# Patient Record
Sex: Female | Born: 1960 | Race: White | Hispanic: No | Marital: Married | Smoking: Never smoker
Health system: Southern US, Community
[De-identification: ages and names within clinical notes are randomized; demographics above are authoritative.]

## PROBLEM LIST (undated history)

## (undated) DIAGNOSIS — G2581 Restless legs syndrome: Secondary | ICD-10-CM

## (undated) DIAGNOSIS — D649 Anemia, unspecified: Secondary | ICD-10-CM

## (undated) DIAGNOSIS — F419 Anxiety disorder, unspecified: Secondary | ICD-10-CM

## (undated) DIAGNOSIS — M8580 Other specified disorders of bone density and structure, unspecified site: Secondary | ICD-10-CM

## (undated) HISTORY — DX: Anemia, unspecified: D64.9

## (undated) HISTORY — PX: ABDOMINAL HYSTERECTOMY: SHX81

## (undated) HISTORY — PX: TUBAL LIGATION: SHX77

## (undated) HISTORY — DX: Anxiety disorder, unspecified: F41.9

## (undated) HISTORY — DX: Restless legs syndrome: G25.81

## (undated) HISTORY — DX: Other specified disorders of bone density and structure, unspecified site: M85.80

## (undated) HISTORY — PX: CHOLECYSTECTOMY: SHX55

---

## 1997-10-19 ENCOUNTER — Encounter: Admission: RE | Admit: 1997-10-19 | Discharge: 1998-01-17 | Payer: Self-pay | Admitting: *Deleted

## 2001-12-03 ENCOUNTER — Other Ambulatory Visit: Admission: RE | Admit: 2001-12-03 | Discharge: 2001-12-03 | Payer: Self-pay | Admitting: Obstetrics & Gynecology

## 2002-12-09 ENCOUNTER — Ambulatory Visit (HOSPITAL_COMMUNITY): Admission: RE | Admit: 2002-12-09 | Discharge: 2002-12-09 | Payer: Self-pay | Admitting: Gastroenterology

## 2003-11-24 ENCOUNTER — Other Ambulatory Visit: Admission: RE | Admit: 2003-11-24 | Discharge: 2003-11-24 | Payer: Self-pay | Admitting: Obstetrics and Gynecology

## 2004-11-12 ENCOUNTER — Encounter: Admission: RE | Admit: 2004-11-12 | Discharge: 2004-12-19 | Payer: Self-pay | Admitting: Family Medicine

## 2005-02-12 ENCOUNTER — Other Ambulatory Visit: Admission: RE | Admit: 2005-02-12 | Discharge: 2005-02-12 | Payer: Self-pay | Admitting: Obstetrics and Gynecology

## 2006-08-04 ENCOUNTER — Other Ambulatory Visit: Admission: RE | Admit: 2006-08-04 | Discharge: 2006-08-04 | Payer: Self-pay | Admitting: Family Medicine

## 2009-04-06 ENCOUNTER — Encounter (INDEPENDENT_AMBULATORY_CARE_PROVIDER_SITE_OTHER): Payer: Self-pay | Admitting: Obstetrics and Gynecology

## 2009-04-06 ENCOUNTER — Inpatient Hospital Stay (HOSPITAL_COMMUNITY): Admission: RE | Admit: 2009-04-06 | Discharge: 2009-04-08 | Payer: Self-pay | Admitting: Obstetrics and Gynecology

## 2010-07-29 ENCOUNTER — Encounter
Admission: RE | Admit: 2010-07-29 | Discharge: 2010-07-29 | Payer: Self-pay | Source: Home / Self Care | Attending: Obstetrics and Gynecology | Admitting: Obstetrics and Gynecology

## 2010-08-21 ENCOUNTER — Encounter: Payer: Self-pay | Admitting: Obstetrics and Gynecology

## 2010-10-25 LAB — URINALYSIS, ROUTINE W REFLEX MICROSCOPIC
Bilirubin Urine: NEGATIVE
Nitrite: NEGATIVE
Protein, ur: NEGATIVE mg/dL
Specific Gravity, Urine: <1.005 — ABNORMAL LOW (ref 1.005–1.030)
Urobilinogen, UA: 0.2 mg/dL (ref 0.0–1.0)

## 2010-10-25 LAB — HEMOGLOBIN AND HEMATOCRIT, BLOOD
HCT: 38.4 % (ref 36.0–46.0)
Hemoglobin: 12.6 g/dL (ref 12.0–15.0)

## 2010-10-25 LAB — COMPREHENSIVE METABOLIC PANEL
ALT: 9 U/L (ref 0–35)
AST: 19 U/L (ref 0–37)
CO2: 28 mEq/L (ref 19–32)
Calcium: 9.3 mg/dL (ref 8.4–10.5)
Chloride: 103 mEq/L (ref 96–112)
Creatinine, Ser: 0.57 mg/dL (ref 0.4–1.2)
GFR calc non Af Amer: >60 mL/min (ref >60)
Glucose, Bld: 90 mg/dL (ref 70–99)
Sodium: 138 mEq/L (ref 135–145)
Total Bilirubin: 0.4 mg/dL (ref 0.3–1.2)

## 2010-10-25 LAB — URINE MICROSCOPIC-ADD ON

## 2010-10-25 LAB — CBC
Hemoglobin: 11.4 g/dL — ABNORMAL LOW (ref 12.0–15.0)
Hemoglobin: 12.8 g/dL (ref 12.0–15.0)
MCHC: 32.9 g/dL (ref 30.0–36.0)
MCV: 82.6 fL (ref 78.0–100.0)
RBC: 4.1 MIL/uL (ref 3.87–5.11)
RBC: 4.69 MIL/uL (ref 3.87–5.11)
WBC: 11.3 10*3/uL — ABNORMAL HIGH (ref 4.0–10.5)
WBC: 15.6 10*3/uL — ABNORMAL HIGH (ref 4.0–10.5)

## 2010-10-25 LAB — PREGNANCY, URINE: Preg Test, Ur: NEGATIVE

## 2010-10-25 LAB — APTT: aPTT: 28 seconds (ref 24–37)

## 2010-12-06 NOTE — Op Note (Signed)
   NAME:  Susan Whitney, Susan Whitney                            ACCOUNT NO.:  [id number]   MEDICAL RECORD NO.:  [id number]                   PATIENT TYPE:  AMB   LOCATION:  ENDO                                 FACILITY:  [facility]   PHYSICIAN:  John C. Madilyn Fireman, M.D.                 DATE OF BIRTH:  Jun 05, 1961   DATE OF PROCEDURE:  12/09/2002  DATE OF DISCHARGE:                                 OPERATIVE REPORT   PROCEDURE:  Colonoscopy.   INDICATIONS FOR PROCEDURE:  Iron deficiency anemia and possible family  history of colon cancer in a first-degree relative.   DESCRIPTION OF PROCEDURE:  The patient was placed in the left lateral  decubitus position and placed on the pulse monitor with continuous low-flow  oxygen delivered by nasal cannula.  She was sedated with 62.5 mcg IV  fentanyl and 6 mg IV Versed.  The Olympus video colonoscope was inserted  into the rectum and advanced to the cecum, confirmed by transillumination at  McBurney's point and visualization of the ileocecal valve and appendiceal  orifice.  The prep was excellent.  The cecum, ascending, transverse,  descending, and sigmoid colon all appeared normal with no masses, polyps,  diverticula, or other mucosal abnormalities.  The rectum likewise appeared  normal, and retroflexed view of the anus revealed no obvious internal  hemorrhoids.  The colonoscope was then withdrawn, and the patient returned  to the recovery room in stable condition.  She tolerated the procedure well,  and there were no immediate complications.   IMPRESSION:  Normal colonoscopy.   PLAN:  Consider other causes of her iron deficiency anemia.  No further  gastrointestinal work-up planned at this point.                                               John C. Madilyn Fireman, M.D.    [facility]/MEDQ  D:  12/09/2002  T:  12/09/2002  Job:  [id number]   cc:   Ernestina Penna, M.D.  [address] [place]  [place] [address]  Fax: [phone]

## 2012-11-09 ENCOUNTER — Ambulatory Visit (INDEPENDENT_AMBULATORY_CARE_PROVIDER_SITE_OTHER): Payer: BC Managed Care – PPO | Admitting: Nurse Practitioner

## 2012-11-09 VITALS — BP 114/73 | HR 84 | Temp 97.0°F | Ht 66.0 in | Wt 208.0 lb

## 2012-11-09 DIAGNOSIS — R05 Cough: Secondary | ICD-10-CM

## 2012-11-09 MED ORDER — HYDROCODONE-HOMATROPINE 5-1.5 MG/5ML PO SYRP: 5.0000 mL | ORAL_SOLUTION | Freq: Three times a day (TID) | ORAL | Status: DC | PRN

## 2012-11-09 MED ORDER — METHYLPREDNISOLONE ACETATE 80 MG/ML IJ SUSP
80.0000 mg | Freq: Once | INTRAMUSCULAR | Status: AC
  Administered 2012-11-09: 80 mg via INTRAMUSCULAR

## 2012-11-09 NOTE — Progress Notes (Signed)
  Subjective:    Patient ID: Charlett Lango, female    DOB: [birth year], 52 y.o.   MRN: [id number]  HPI-Patient in complaining of Cough . Started 3week. Has been unchanged since started. Associated symptoms include nasal congestion. She has tried tussin OTC with slight relief.     Review of Systems  Constitutional: Negative for fever and chills.  HENT: Positive for congestion. Negative for ear pain, rhinorrhea, sneezing, postnasal drip and sinus pressure.   Eyes: Negative.   Respiratory: Positive for cough (nonproductive).   Cardiovascular: Negative.   Gastrointestinal: Negative.   Psychiatric/Behavioral: Negative.        Objective:   Physical Exam  Constitutional: She is oriented to person, place, and time. She appears well-developed and well-nourished.  HENT:  Head: Normocephalic.  Right Ear: External ear normal.  Nose: Nose normal.  Mouth/Throat: Oropharynx is clear and moist.  Eyes: Pupils are equal, round, and reactive to light.  Neck: Normal range of motion. Neck supple.  Cardiovascular: Normal rate, regular rhythm, normal heart sounds and intact distal pulses.   No murmur heard. Pulmonary/Chest: Effort normal and breath sounds normal. She has no wheezes. She has no rales.  Dry cough  Lymphadenopathy:    She has no cervical adenopathy.  Neurological: She is alert and oriented to person, place, and time.  Skin: Skin is warm and dry.   BP 114/73  Pulse 84  Temp(Src) 97 F (36.1 C) (Oral)  Ht 5\' 6"  (1.676 m)  Wt 208 lb (94.348 kg)  BMI 33.59 kg/m2        Assessment & Plan:  1. Cough First 24 Hours-Clear liquids  popsicles  Jello  gatorade  Sprite Second 24 hours-Add Full liquids ( Liquids you cant see through) Third 24 hours- Bland diet ( foods that are baked or broiled)  *avoiding fried foods and highly spiced foods* During these 3 days  Avoid milk, cheese, ice cream or any other dairy products  Avoid caffeine- REMEMBER Mt. Dew and Mello Yellow contain  lots of caffeine You should eat and drink in  Frequent small volumes If no improvement in symptoms or worsen in 2-3 days should RETRUN TO OFFICE or go to ER! -Hycodan 1 tsp po Q6 prn cough - methylPREDNISolone acetate (DEPO-MEDROL) injection 80 mg; Inject 1 mL (80 mg total) into the muscle once. [name], FNP

## 2012-11-09 NOTE — Patient Instructions (Signed)

## 2012-11-24 ENCOUNTER — Encounter: Payer: Self-pay | Admitting: Nurse Practitioner

## 2012-11-24 ENCOUNTER — Ambulatory Visit (INDEPENDENT_AMBULATORY_CARE_PROVIDER_SITE_OTHER): Payer: BC Managed Care – PPO | Admitting: Nurse Practitioner

## 2012-11-24 VITALS — BP 114/76 | HR 68 | Temp 97.5°F | Ht 65.5 in | Wt 208.0 lb

## 2012-11-24 DIAGNOSIS — J209 Acute bronchitis, unspecified: Secondary | ICD-10-CM

## 2012-11-24 MED ORDER — HYDROCODONE-HOMATROPINE 5-1.5 MG/5ML PO SYRP: 5.0000 mL | ORAL_SOLUTION | Freq: Three times a day (TID) | ORAL | Status: DC | PRN

## 2012-11-24 MED ORDER — AZITHROMYCIN 250 MG PO TABS: ORAL_TABLET | ORAL | Status: DC

## 2012-11-24 MED ORDER — PREDNISONE 20 MG PO TABS: ORAL_TABLET | ORAL | Status: DC

## 2012-11-24 NOTE — Progress Notes (Signed)
  Subjective:    Patient ID: Susan Whitney, female    DOB: 21-Jun-1961, 52 y.o.   MRN: [id number]  Cough This is a new problem. The current episode started 1 to 4 weeks ago. The problem has been waxing and waning. The problem occurs constantly. The cough is productive of sputum. Associated symptoms include rhinorrhea and shortness of breath (very mild). Pertinent negatives include no ear congestion, ear pain, fever, headaches, nasal congestion or postnasal drip. Nothing aggravates the symptoms. She has tried OTC cough suppressant (steriod shot 9 days ago) for the symptoms. The treatment provided mild relief.  - Patient here for follow up of cough. Has had bronchitis for about 2 weeks now and cough just will not seem to go away.    Review of Systems  Constitutional: Negative for fever.  HENT: Positive for congestion and rhinorrhea. Negative for ear pain, postnasal drip and ear discharge.   Respiratory: Positive for cough and shortness of breath (very mild).   Neurological: Negative for headaches.       Objective:   Physical Exam  Constitutional: She appears well-developed and well-nourished.  HENT:  Head: Normocephalic.  Right Ear: External ear normal.  Left Ear: External ear normal.  Mouth/Throat: Oropharynx is clear and moist.  Eyes: EOM are normal. Pupils are equal, round, and reactive to light.  Neck: Normal range of motion. Neck supple.  Cardiovascular: Normal rate, normal heart sounds and intact distal pulses.   Pulmonary/Chest: Effort normal.  Scattered exp rhonchi throughout lungs  Lymphadenopathy:    She has no cervical adenopathy.   BP 114/76  Pulse 68  Temp(Src) 97.5 F (36.4 C) (Oral)  Ht 5' 5.5" (1.664 m)  Wt 208 lb (94.348 kg)  BMI 34.07 kg/m2        Assessment & Plan:  1. Acute bronchitis Force fluids Rest RTO prn - predniSONE (DELTASONE) 20 MG tablet; 2 PO qd  Dispense: 10 tablet; Refill: 0 - azithromycin (ZITHROMAX Z-PAK) 250 MG tablet; As directed   Dispense: 6 each; Refill: 0 - HYDROcodone-homatropine (HYCODAN) 5-1.5 MG/5ML syrup; Take 5 mLs by mouth every 8 (eight) hours as needed for cough.  Dispense: 120 mL; Refill: 0  Susan Daphine Deutscher, FNP

## 2012-11-24 NOTE — Patient Instructions (Signed)

## 2012-11-30 ENCOUNTER — Other Ambulatory Visit: Payer: Self-pay | Admitting: Nurse Practitioner

## 2012-12-23 ENCOUNTER — Encounter: Payer: Self-pay | Admitting: Family Medicine

## 2012-12-23 ENCOUNTER — Ambulatory Visit (INDEPENDENT_AMBULATORY_CARE_PROVIDER_SITE_OTHER): Payer: BC Managed Care – PPO

## 2012-12-23 ENCOUNTER — Ambulatory Visit (INDEPENDENT_AMBULATORY_CARE_PROVIDER_SITE_OTHER): Payer: BC Managed Care – PPO | Admitting: Family Medicine

## 2012-12-23 VITALS — BP 116/76 | HR 68 | Temp 96.9°F | Ht 65.0 in | Wt 206.0 lb

## 2012-12-23 DIAGNOSIS — R05 Cough: Secondary | ICD-10-CM

## 2012-12-23 DIAGNOSIS — J9801 Acute bronchospasm: Secondary | ICD-10-CM

## 2012-12-23 LAB — POCT CBC
Granulocyte percent: 71.4 %G (ref 37–80)
Lymph, poc: 2.4 (ref 0.6–3.4)
MCV: 81.5 fL (ref 80–97)
MPV: 8 fL (ref 0–99.8)
POC Granulocyte: 7.4 — AB (ref 2–6.9)
POC LYMPH PERCENT: 23.2 %L (ref 10–50)
Platelet Count, POC: 332 10*3/uL (ref 142–424)
RBC: 5.1 M/uL (ref 4.04–5.48)
RDW, POC: 14.6 %
WBC: 10.4 10*3/uL — AB (ref 4.6–10.2)

## 2012-12-23 LAB — COMPLETE METABOLIC PANEL WITH GFR
ALT: <8 U/L (ref 0–35)
AST: 14 U/L (ref 0–37)
Albumin: 4.2 g/dL (ref 3.5–5.2)
Alkaline Phosphatase: 103 U/L (ref 39–117)
BUN: 13 mg/dL (ref 6–23)
Chloride: 102 mEq/L (ref 96–112)
Potassium: 4.9 mEq/L (ref 3.5–5.3)
Sodium: 137 mEq/L (ref 135–145)

## 2012-12-23 MED ORDER — HYDROCOD POLST-CHLORPHEN POLST 10-8 MG/5ML PO LQCR: 5.0000 mL | Freq: Every evening | ORAL | Status: DC | PRN

## 2012-12-23 MED ORDER — PREDNISONE 10 MG PO TABS: ORAL_TABLET | ORAL | Status: DC

## 2012-12-23 MED ORDER — METHYLPREDNISOLONE ACETATE 80 MG/ML IJ SUSP
60.0000 mg | Freq: Once | INTRAMUSCULAR | Status: AC
  Administered 2012-12-23: 60 mg via INTRAMUSCULAR

## 2012-12-23 MED ORDER — BUDESONIDE-FORMOTEROL FUMARATE 160-4.5 MCG/ACT IN AERO: 2.0000 | INHALATION_SPRAY | Freq: Two times a day (BID) | RESPIRATORY_TRACT | Status: DC

## 2012-12-23 NOTE — Patient Instructions (Addendum)
Drink plenty of fluids Use a cool mist humidifier in the bedroom at nighttime Take plain Mucinex maximum strength over-the-counter one twice daily with a large glass of water regularly Use Symbicort 160/4.5  2 puffs twice daily, rinse mouth after using Take medications as directed

## 2012-12-23 NOTE — Addendum Note (Signed)
Addended by: Baltazar Apo on: 12/23/2012 12:35 PM   Modules accepted: Orders

## 2012-12-23 NOTE — Progress Notes (Signed)
Subjective:    Patient ID: Susan Whitney, female    DOB: 1960-10-25, 52 y.o.   MRN: [id number]  HPI Patient has had a persistent cough for up to 6 weeks. She saw a provider about 3 weeks ago and was treated with Zithromax and prednisone. This helped some while she was taking it but the problem has returned. The cough is nonproductive. There has been no fever. No significant seasonal allergy history. She does have a pet dog that stays in the house. She has been wheezing at times. This is worse at night and in the morning when she gets out of bed. One of the biggest complaints is that persistent cough.   Review of Systems Review of systems is essentially negative except for the cough and the persistent nature of this cough. She denies chest pain headaches sore throat ear pain GI symptoms urinary symptoms.    Objective:   Physical Exam  Vitals reviewed. Constitutional: She is oriented to person, place, and time. She appears well-developed and well-nourished. No distress.  HENT:  Head: Normocephalic and atraumatic.  Right Ear: External ear normal.  Left Ear: External ear normal.  Mouth/Throat: No oropharyngeal exudate.  Tonsils are prominent bilaterally right greater than left. There is nasal congestion bilaterally with turbinate swelling.  Eyes: Conjunctivae are normal. Right eye exhibits no discharge. Left eye exhibits no discharge. No scleral icterus.  Neck: Normal range of motion. Neck supple. No thyromegaly present.  Cardiovascular: Normal rate, regular rhythm and normal heart sounds.  Exam reveals no gallop.   No murmur heard. Pulmonary/Chest: Effort normal and breath sounds normal. No respiratory distress. She has no wheezes. She has no rales.  Abdominal: Soft. Bowel sounds are normal. She exhibits no mass. There is no tenderness. There is no rebound and no guarding.  Musculoskeletal: Normal range of motion.  Lymphadenopathy:    She has no cervical adenopathy (anterior cervical  tenderness).  Neurological: She is alert and oriented to person, place, and time.  Skin: Skin is warm and dry. No rash noted. No erythema.  Psychiatric: She has a normal mood and affect. Her behavior is normal. Judgment and thought content normal.      Results for orders placed in visit on 12/23/12  POCT CBC      Result Value Range   WBC 10.4 (*) 4.6 - 10.2 K/uL   Lymph, poc 2.4  0.6 - 3.4   POC LYMPH PERCENT 23.2  10 - 50 %L   POC Granulocyte 7.4 (*) 2 - 6.9   Granulocyte percent 71.4  37 - 80 %G   RBC 5.1  4.04 - 5.48 M/uL   Hemoglobin 13.9  12.2 - 16.2 g/dL   HCT, POC 40.9  81.1 - 47.9 %   MCV 81.5  80 - 97 fL   MCH, POC 27.3  27 - 31.2 pg   MCHC 33.5  31.8 - 35.4 g/dL   RDW, POC 91.4     Platelet Count, POC 332.0  142 - 424 K/uL   MPV 8.0  0 - 99.8 fL         Assessment & Plan:  1. Cough - POCT CBC - COMPLETE METABOLIC PANEL WITH GFR - DG Chest 2 View; Future - predniSONE (DELTASONE) 10 MG tablet; 1 tablet 4 times a day for 2 days,  1 tablet 3 times a day for 2 days,  1 tablet 2 times a day for 2 days, 1 tablet daily for 2 days  Dispense: 20 tablet;  Refill: 0 - chlorpheniramine-HYDROcodone (TUSSIONEX PENNKINETIC ER) 10-8 MG/5ML LQCR; Take 5 mLs by mouth at bedtime as needed.  Dispense: 140 mL; Refill: 0  2. Bronchospasm - predniSONE (DELTASONE) 10 MG tablet; 1 tablet 4 times a day for 2 days,  1 tablet 3 times a day for 2 days,  1 tablet 2 times a day for 2 days, 1 tablet daily for 2 days  Dispense: 20 tablet; Refill: 0  Patient Instructions  Drink plenty of fluids Use a cool mist humidifier in the bedroom at nighttime Take plain Mucinex maximum strength over-the-counter one twice daily with a large glass of water regularly Use Symbicort 160/4.5  2 puffs twice daily, rinse mouth after using Take medications as directed

## 2012-12-23 NOTE — Addendum Note (Signed)
Addended by: Baltazar Apo on: 12/23/2012 12:37 PM   Modules accepted: Orders

## 2013-02-14 ENCOUNTER — Ambulatory Visit (INDEPENDENT_AMBULATORY_CARE_PROVIDER_SITE_OTHER): Payer: BC Managed Care – PPO | Admitting: Nurse Practitioner

## 2013-02-14 ENCOUNTER — Encounter: Payer: Self-pay | Admitting: Nurse Practitioner

## 2013-02-14 VITALS — BP 116/81 | HR 69 | Temp 97.2°F | Ht 65.0 in | Wt 208.0 lb

## 2013-02-14 DIAGNOSIS — J019 Acute sinusitis, unspecified: Secondary | ICD-10-CM

## 2013-02-14 DIAGNOSIS — F329 Major depressive disorder, single episode, unspecified: Secondary | ICD-10-CM

## 2013-02-14 DIAGNOSIS — B9689 Other specified bacterial agents as the cause of diseases classified elsewhere: Secondary | ICD-10-CM

## 2013-02-14 MED ORDER — AZITHROMYCIN 250 MG PO TABS: ORAL_TABLET | ORAL | Status: DC

## 2013-02-14 NOTE — Progress Notes (Signed)
  Subjective:    Patient ID: Charlett Lango, female    DOB: 1960/12/01, 52 y.o.   MRN: [id number]  HPI Patient in C/O sore throat and cough- Has had a low grade fever and just doesn't feel good. Says that she has a slight cough but feels like it is worsening everyday.    Review of Systems  Constitutional: Positive for fever (low garde). Negative for fatigue.  HENT: Positive for congestion, sore throat, rhinorrhea, trouble swallowing, voice change, postnasal drip and sinus pressure. Negative for ear pain.   Respiratory: Positive for cough (productive).   Cardiovascular: Negative.   Gastrointestinal: Negative.   Genitourinary: Negative.   Musculoskeletal: Negative.   Neurological: Negative.   Hematological: Negative.   Psychiatric/Behavioral: Negative.        Objective:   Physical Exam  Constitutional: She appears well-developed and well-nourished.  HENT:  Right Ear: Hearing, external ear and ear canal normal. Tympanic membrane is erythematous.  Left Ear: Hearing, external ear and ear canal normal. Tympanic membrane is erythematous.  Nose: Mucosal edema and rhinorrhea present. Right sinus exhibits maxillary sinus tenderness. Right sinus exhibits no frontal sinus tenderness. Left sinus exhibits maxillary sinus tenderness. Left sinus exhibits no frontal sinus tenderness.  Mouth/Throat: Posterior oropharyngeal erythema: mild.  Cardiovascular: Normal rate, regular rhythm and normal heart sounds.   Pulmonary/Chest: Effort normal and breath sounds normal.  Dry cough  Skin: Skin is warm and dry.    BP 116/81  Pulse 69  Temp(Src) 97.2 F (36.2 C) (Oral)  Ht 5\' 5"  (1.651 m)  Wt 208 lb (94.348 kg)  BMI 34.61 kg/m2       Assessment & Plan:  1. Acute bacterial rhinosinusitis 1. Take meds as prescribed 2. Use a cool mist humidifier especially during the winter months and when heat has  been humid. 3. Use saline nose sprays frequently 4. Saline irrigations of the nose can be very  helpful if done frequently.  * 4X daily for 1 week*  * Use of a nettie pot can be helpful with this. Follow directions with this* 5. Drink plenty of fluids 6. Keep thermostat turn down low 7.For any cough or congestion  Use plain Mucinex- regular strength or max strength is fine   * Children- consult with Pharmacist for dosing 8. For fever or aces or pains- take tylenol or ibuprofen appropriate for age and weight.  * for fevers greater than 101 orally you may alternate ibuprofen and tylenol every  3 hours.   - azithromycin (ZITHROMAX Z-PAK) 250 MG tablet; As directed  Dispense: 6 each; Refill: 0 [name], FNP

## 2013-02-14 NOTE — Patient Instructions (Signed)

## 2013-03-03 ENCOUNTER — Encounter: Payer: Self-pay | Admitting: Family Medicine

## 2013-03-03 ENCOUNTER — Ambulatory Visit (INDEPENDENT_AMBULATORY_CARE_PROVIDER_SITE_OTHER): Payer: BC Managed Care – PPO

## 2013-03-03 ENCOUNTER — Ambulatory Visit (INDEPENDENT_AMBULATORY_CARE_PROVIDER_SITE_OTHER): Payer: BC Managed Care – PPO | Admitting: Family Medicine

## 2013-03-03 VITALS — BP 106/68 | HR 85 | Temp 97.9°F | Wt 214.4 lb

## 2013-03-03 DIAGNOSIS — D72829 Elevated white blood cell count, unspecified: Secondary | ICD-10-CM

## 2013-03-03 DIAGNOSIS — R05 Cough: Secondary | ICD-10-CM

## 2013-03-03 LAB — POCT CBC
Granulocyte percent: 72.1 %G (ref 37–80)
HCT, POC: 38.6 % (ref 37.7–47.9)
Lymph, poc: 2.8 (ref 0.6–3.4)
POC Granulocyte: 9.8 — AB (ref 2–6.9)
Platelet Count, POC: 334 10*3/uL (ref 142–424)
RBC: 4.8 M/uL (ref 4.04–5.48)
RDW, POC: 14.6 %
WBC: 13.6 10*3/uL — AB (ref 4.6–10.2)

## 2013-03-03 LAB — POCT RAPID STREP A (OFFICE): Rapid Strep A Screen: NEGATIVE

## 2013-03-03 MED ORDER — METHYLPREDNISOLONE ACETATE 80 MG/ML IJ SUSP
60.0000 mg | Freq: Once | INTRAMUSCULAR | Status: AC
  Administered 2013-03-03: 60 mg via INTRAMUSCULAR

## 2013-03-03 MED ORDER — PREDNISONE 10 MG PO TABS: 10.0000 mg | ORAL_TABLET | Freq: Every day | ORAL | Status: DC

## 2013-03-03 MED ORDER — CEFUROXIME AXETIL 250 MG PO TABS: 250.0000 mg | ORAL_TABLET | Freq: Two times a day (BID) | ORAL | Status: DC

## 2013-03-03 NOTE — Patient Instructions (Signed)
Take meds as directed Referral to Dr Jetty Duhamel if no better in 10-14  Days.

## 2013-03-03 NOTE — Progress Notes (Signed)
Subjective:    Patient ID: Susan Whitney, female    DOB: 1961/04/13, 52 y.o.   MRN: [id number]  HPI Patient had an episode of cough and congestion for several weeks back in the spring. She had a chest x-ray around June 6 which was normal. In this initial episode she took antibiotics. She finally got better after the prednisone and Depo-Medrol. She was doing better until about 10 days ago and had chills and a sore throat and redeveloped another cough and took another Z-Pak which she has completed. The achiness most recently has gotten better but she continues on with a persistent cough.Now she has no sore throat and occasionally feels like she has tears that are stopped the    Review of Systems  Constitutional: Positive for chills (at night) and fatigue.  HENT: Negative for ear pain, congestion, sore throat, voice change, postnasal drip and sinus pressure.   Eyes: Positive for discharge (watery).  Respiratory: Positive for cough, chest tightness and shortness of breath.   Cardiovascular: Negative for chest pain, palpitations and leg swelling.  Gastrointestinal: Positive for nausea (slight). Negative for abdominal pain, diarrhea and constipation.  Genitourinary: Negative.   Musculoskeletal: Positive for myalgias (at night, achiness due to cough).  Allergic/Immunologic: Negative for environmental allergies.  Neurological: Positive for light-headedness and headaches.  Hematological: Negative.   Psychiatric/Behavioral: Negative.        Objective:   Physical Exam  Constitutional: She is oriented to person, place, and time. She appears well-developed and well-nourished. No distress.  HENT:  Head: Normocephalic and atraumatic.  Right Ear: External ear normal.  Left Ear: External ear normal.  Nose: Nose normal.  Slight swelling posterior throat, left greater than right  Eyes: Conjunctivae are normal. Right eye exhibits no discharge. Left eye exhibits no discharge. No scleral icterus.  Neck:  Normal range of motion. Neck supple. No thyromegaly present.  There was cervical tenderness bilateral  Cardiovascular: Normal rate, regular rhythm and normal heart sounds.   Pulmonary/Chest: Effort normal and breath sounds normal. No respiratory distress. She has no wheezes. She has no rales.  Musculoskeletal: Normal range of motion.  Lymphadenopathy:    She has no cervical adenopathy.  Neurological: She is alert and oriented to person, place, and time.  Skin: Skin is warm and dry. No rash noted. She is not diaphoretic.  Psychiatric: Her behavior is normal. Judgment and thought content normal.   Results for orders placed in visit on 03/03/13  POCT CBC      Result Value Range   WBC 13.6 (*) 4.6 - 10.2 K/uL   Lymph, poc 2.8  0.6 - 3.4   POC LYMPH PERCENT 20.9  10 - 50 %L   POC Granulocyte 9.8 (*) 2 - 6.9   Granulocyte percent 72.1  37 - 80 %G   RBC 4.8  4.04 - 5.48 M/uL   Hemoglobin 13.1  12.2 - 16.2 g/dL   HCT, POC 40.9  81.1 - 47.9 %   MCV 80.3  80 - 97 fL   MCH, POC 27.3  27 - 31.2 pg   MCHC 34.0  31.8 - 35.4 g/dL   RDW, POC 91.4     Platelet Count, POC 334.0  142 - 424 K/uL   MPV 7.8  0 - 99.8 fL    [facility] reading (PRIMARY) by  Dr. Christell Constant: CXR: No change from previous chest x-ray                                -  Rapid strep was negative and patient aware       Assessment & Plan:  1. Cough - POCT CBC - POCT rapid strep A - Upper Respiratory Culture, Routine - DG Chest 2 View  2. Elevated white blood cell count - POCT rapid strep A - Upper Respiratory Culture, Routine - DG Chest 2 View -Throat culture pending  3. Bronchitis- -Ceftin 250 twice daily for 10 days -60 of Depo-Medrol IM -Prednisone 10 mg #20 taper -Take Mucinex blue and white, maximum strength one twice daily with a large glass water -Use Symbicort 164.5, one puff twice daily, rinse mouth after use  Patient Instructions  Take meds as directed Referral to Dr Jetty Duhamel if no better in 10-14   Days.   Drink plenty of fluids  Take Tylenol for aches pains and fever  Nyra Capes MD

## 2013-03-03 NOTE — Addendum Note (Signed)
Addended by: Bearl Mulberry on: 03/03/2013 05:28 PM   Modules accepted: Orders

## 2013-03-05 ENCOUNTER — Other Ambulatory Visit: Payer: Self-pay | Admitting: Nurse Practitioner

## 2013-03-06 LAB — UPPER RESPIRATORY CULTURE, ROUTINE

## 2013-04-27 ENCOUNTER — Ambulatory Visit (INDEPENDENT_AMBULATORY_CARE_PROVIDER_SITE_OTHER): Payer: BC Managed Care – PPO | Admitting: Internal Medicine

## 2013-04-27 ENCOUNTER — Encounter: Payer: Self-pay | Admitting: Internal Medicine

## 2013-04-27 VITALS — BP 104/70 | HR 73 | Temp 98.1°F | Ht 65.5 in | Wt 217.0 lb

## 2013-04-27 DIAGNOSIS — R05 Cough: Secondary | ICD-10-CM | POA: Insufficient documentation

## 2013-04-27 DIAGNOSIS — J329 Chronic sinusitis, unspecified: Secondary | ICD-10-CM

## 2013-04-27 DIAGNOSIS — R059 Cough, unspecified: Secondary | ICD-10-CM | POA: Insufficient documentation

## 2013-04-27 MED ORDER — PREDNISONE (PAK) 10 MG PO TABS: ORAL_TABLET | ORAL | Status: DC

## 2013-04-27 MED ORDER — FAMOTIDINE 20 MG PO TABS: ORAL_TABLET | ORAL | Status: DC

## 2013-04-27 MED ORDER — PANTOPRAZOLE SODIUM 40 MG PO TBEC: 40.0000 mg | DELAYED_RELEASE_TABLET | Freq: Every day | ORAL | Status: DC

## 2013-04-27 NOTE — Patient Instructions (Signed)
Pantoprazole (protonix) 40 mg   Take 30-60 min before first meal of the day and Pepcid 20 mg one bedtime along with chlortrimeton 4 mg  until return to office - this is the best way to tell whether stomach acid is contributing to your problem.    Delsym 2 tsp every 12 hours (tessalon pearls also might work would need to call it in if needed)   Prednisone 10 mg take  4 each am x 2 days,   2 each am x 2 days,  1 each am x 2 days and stop   Please see patient coordinator before you leave today  to schedule sinus ct  GERD (REFLUX)  is an extremely common cause of respiratory symptoms, many times with no significant heartburn at all.    It can be treated with medication, but also with lifestyle changes including avoidance of late meals, excessive alcohol, smoking cessation, and avoid fatty foods, chocolate, peppermint, colas, red wine, and acidic juices such as orange juice.  NO MINT OR MENTHOL PRODUCTS SO NO COUGH DROPS  USE SUGARLESS CANDY INSTEAD (jolley ranchers or Stover's)  NO OIL BASED VITAMINS - use powdered substitutes.    Please schedule a follow up office visit in 4 weeks, sooner if needed

## 2013-04-27 NOTE — Progress Notes (Signed)
  Subjective:    Patient ID: Susan Whitney, female    DOB: 1961/04/04   MRN: [id number]  HPI  47 yowf never smoker but lots of passive smoke as child/adult and recurrent ear infections/bronchitis but ok activity tol and no need for inhalers and got fine in between bouts and got better out of smokey environments in past  then developed chronic cough starting May 2014 and referred to pulmonary clinic 04/27/2013 by Dr Christell Constant for eval of persistent cough.     04/27/2013 1st [facility] office visit/ Kanaan Kagawa cc daily cough starting May 2014 rx for allergies and did completely eliminate the cough s need for cough med x one month then indolent onset progressively worse cough then cleared again on pred.  Taking mucinex dm some better, no better on inhaler ? Ventolin > heart racing  Cough wakes her up in am > yellowish thick min phlegm with nasal congestion some better p stirring and then worse again at bedtime pretty much every night  No obvious day to day or daytime variabilty or assoc  cp or chest tightness, subjective wheeze overt sinus or hb symptoms. No unusual exp hx or h/o childhood pna/ asthma or knowledge of premature birth.   . Also denies any obvious fluctuation of symptoms with weather or environmental changes or other aggravating or alleviating factors except as outlined above   Current Medications, Allergies, Complete Past Medical History, Past Surgical History, Family History, and Social History were reviewed in [employer] record.          Review of Systems  Constitutional: Negative for fever, chills and unexpected weight change.  HENT: Positive for congestion, postnasal drip, rhinorrhea, sneezing and sore throat. Negative for dental problem, ear pain, nosebleeds, sinus pressure, trouble swallowing and voice change.   Eyes: Negative for visual disturbance.  Respiratory: Positive for cough and shortness of breath. Negative for choking.   Cardiovascular: Negative  for chest pain and leg swelling.  Gastrointestinal: Positive for diarrhea. Negative for vomiting and abdominal pain.  Genitourinary: Negative for difficulty urinating.  Musculoskeletal: Negative for arthralgias.  Skin: Negative for rash.  Neurological: Positive for headaches. Negative for tremors and syncope.  Hematological: Does not bruise/bleed easily.       Objective:   Physical Exam  amb wf slt nasal tone to voice  Wt Readings from Last 3 Encounters:  04/27/13 217 lb (98.431 kg)  03/03/13 214 lb 6.4 oz (97.251 kg)  02/14/13 208 lb (94.348 kg)      HEENT: nl dentition, turbinates, and orophanx. Nl external ear canals without cough reflex   NECK :  without JVD/Nodes/TM/ nl carotid upstrokes bilaterally   LUNGS: no acc muscle use, clear to A and P bilaterally  Cough  on insp    CV:  RRR  no s3 or murmur or increase in P2, no edema   ABD:  soft and nontender with nl excursion in the supine position. No bruits or organomegaly, bowel sounds nl  MS:  warm without deformities, calf tenderness, cyanosis or clubbing  SKIN: warm and dry without lesions    NEURO:  alert, approp, no deficits    cxr 03/03/13 wnl   Sinus CT 04/29/13 Maxillary sinuses: Bilateral air-fluid levels indicating acuity.  Mucosal thickening is associated laterally and superiorly.      Assessment & Plan:

## 2013-04-29 ENCOUNTER — Ambulatory Visit (INDEPENDENT_AMBULATORY_CARE_PROVIDER_SITE_OTHER)
Admission: RE | Admit: 2013-04-29 | Discharge: 2013-04-29 | Disposition: A | Discharge status: Home / Self Care | Payer: BC Managed Care – PPO | Source: Ambulatory Visit | Attending: Internal Medicine | Admitting: Internal Medicine

## 2013-04-29 ENCOUNTER — Encounter: Payer: Self-pay | Admitting: Internal Medicine

## 2013-04-29 DIAGNOSIS — R05 Cough: Secondary | ICD-10-CM

## 2013-04-30 DIAGNOSIS — J329 Chronic sinusitis, unspecified: Secondary | ICD-10-CM | POA: Insufficient documentation

## 2013-04-30 NOTE — Assessment & Plan Note (Signed)
-   Sinus CT 04/29/2013 > Chronic and acute sinusitis> augmentin bid x 10 days then ov

## 2013-04-30 NOTE — Assessment & Plan Note (Addendum)
The most common causes of chronic cough in immunocompetent adults include the following: upper airway cough syndrome (UACS), previously referred to as postnasal drip syndrome (PNDS), which is caused by variety of rhinosinus conditions; (2) asthma; (3) GERD; (4) chronic bronchitis from cigarette smoking or other inhaled environmental irritants; (5) nonasthmatic eosinophilic bronchitis; and (6) bronchiectasis.   These conditions, singly or in combination, have accounted for up to 94% of the causes of chronic cough in prospective studies.   Other conditions have constituted no >6% of the causes in prospective studies These have included bronchogenic carcinoma, chronic interstitial pneumonia, sarcoidosis, left ventricular failure, ACEI-induced cough, and aspiration from a condition associated with pharyngeal dysfunction.    Chronic cough is often simultaneously caused by more than one condition. A single cause has been found from 38 to 82% of the time, multiple causes from 18 to 62%. Multiply caused cough has been the result of three diseases up to 42% of the time.       Most likely this is pnds related to acute and chronic sinusitis (see sep a/p) but given chronicity needs rx for gerd secondary to coughing until at least the cough resolves then regroup  See instructions for specific recommendations which were reviewed directly with the patient who was given a copy with highlighter outlining the key components.

## 2013-05-02 ENCOUNTER — Other Ambulatory Visit: Payer: Self-pay | Admitting: Internal Medicine

## 2013-05-02 MED ORDER — AMOXICILLIN-POT CLAVULANATE 875-125 MG PO TABS: 1.0000 | ORAL_TABLET | Freq: Two times a day (BID) | ORAL | Status: DC

## 2013-05-02 NOTE — Progress Notes (Signed)
Quick Note:  Spoke with pt and notified of results per Dr. Sherene Sires. Pt verbalized understanding and denied any questions. rx was sent to [facility] and rov scheduled for 05/11/13 ______

## 2013-05-11 ENCOUNTER — Ambulatory Visit (INDEPENDENT_AMBULATORY_CARE_PROVIDER_SITE_OTHER): Payer: BC Managed Care – PPO | Admitting: Internal Medicine

## 2013-05-11 ENCOUNTER — Encounter: Payer: Self-pay | Admitting: Internal Medicine

## 2013-05-11 ENCOUNTER — Other Ambulatory Visit (INDEPENDENT_AMBULATORY_CARE_PROVIDER_SITE_OTHER): Payer: BC Managed Care – PPO

## 2013-05-11 VITALS — BP 118/74 | HR 80 | Temp 98.2°F | Ht 65.5 in | Wt 220.0 lb

## 2013-05-11 DIAGNOSIS — J329 Chronic sinusitis, unspecified: Secondary | ICD-10-CM

## 2013-05-11 DIAGNOSIS — R059 Cough, unspecified: Secondary | ICD-10-CM

## 2013-05-11 DIAGNOSIS — R05 Cough: Secondary | ICD-10-CM

## 2013-05-11 LAB — CBC WITH DIFFERENTIAL/PLATELET
Basophils Relative: 1.1 % (ref 0.0–3.0)
Eosinophils Absolute: 0.5 10*3/uL (ref 0.0–0.7)
Eosinophils Relative: 5.9 % — ABNORMAL HIGH (ref 0.0–5.0)
HCT: 39.5 % (ref 36.0–46.0)
Lymphs Abs: 2 10*3/uL (ref 0.7–4.0)
MCHC: 34.1 g/dL (ref 30.0–36.0)
MCV: 79.7 fl (ref 78.0–100.0)
Monocytes Absolute: 0.6 10*3/uL (ref 0.1–1.0)
Platelets: 334 10*3/uL (ref 150.0–400.0)
RBC: 4.96 Mil/uL (ref 3.87–5.11)
WBC: 8.8 10*3/uL (ref 4.5–10.5)

## 2013-05-11 MED ORDER — AMOXICILLIN-POT CLAVULANATE 875-125 MG PO TABS: 1.0000 | ORAL_TABLET | Freq: Two times a day (BID) | ORAL | Status: DC

## 2013-05-11 NOTE — Progress Notes (Signed)
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    History of Present Illness  04/27/2013 1st [facility] office visit/ Roshaun Pound cc daily cough starting May 2014 rx for allergies and did completely eliminate the cough s need for cough med x one month then indolent onset progressively worse cough then cleared again on pred.  Taking mucinex dm some better, no better on inhaler ? Ventolin > heart racing  Cough wakes her up in am > yellowish thick min phlegm with nasal congestion some better p stirring and then worse again at bedtime pretty much every night rec Pantoprazole (protonix) 40 mg   Take 30-60 min before first meal of the day and Pepcid 20 mg one bedtime along with chlortrimeton 4 mg  until return to office - this is the best way to tell whether stomach acid is contributing to your problem.   Delsym 2 tsp every 12 hours (tessalon pearls also might work would need to call it in if needed)  Prednisone 10 mg take  4 each am x 2 days,   2 each am x 2 days,  1 each am x 2 days and stop  Please see patient coordinator before you leave today  to schedule sinus ct> pos bilat > aug x 10 GERD   05/11/2013 f/u ov/Madisen Ludvigsen re: cough x May 2014 but sense of daily throat drainage x > 5 y Chief Complaint  Patient presents with  . Follow-up    pt states cough has improved, only bothers her in mornings and before bed..  non prod cough but feels congestion in throat.  Reflux has improved.    No obvious day to day or daytime variabilty or assoc sob  cp or  chest tightness, subjective wheeze overt sinus or hb symptoms. No unusual exp hx or h/o childhood pna/ asthma or knowledge of premature birth.  Sleeping ok without nocturnal  or early am exacerbation  of respiratory  c/o's or need for noct saba. Also denies any obvious fluctuation of symptoms with weather or environmental changes or other aggravating or alleviating factors except as outlined above   Current Medications, Allergies, Complete Past Medical History, Past Surgical History, Family History, and Social History were reviewed in [employer] record.  ROS  The following are not active complaints unless bolded sore throat, dysphagia, dental problems, itching, sneezing,  nasal congestion or excess/ purulent secretions, ear ache,   fever, chills, sweats, unintended wt loss, pleuritic or exertional cp, hemoptysis,  orthopnea pnd or leg swelling, presyncope, palpitations, heartburn, abdominal pain, anorexia, nausea, vomiting, diarrhea  or change in bowel or urinary habits, change in stools or urine, dysuria,hematuria,  rash, arthralgias, visual complaints, headache, numbness weakness or ataxia or problems with walking or coordination,  change in mood/affect or memory.        .                Objective:   Physical Exam  amb wf  No longer nasal tone  05/11/2013  220  Wt Readings from Last 3 Encounters:  04/27/13 217 lb (98.431 kg)  03/03/13 214 lb 6.4 oz (97.251 kg)  02/14/13 208 lb (94.348 kg)      HEENT: nl dentition, turbinates, and orophanx. Nl external ear canals without cough reflex. R esotropic strabismus   NECK :  without JVD/Nodes/TM/ nl carotid upstrokes bilaterally   LUNGS: no acc muscle use, clear to A and P bilaterally  Cough  on insp    CV:  RRR  no s3 or murmur or increase in P2, no edema   ABD:  soft and nontender with nl excursion in the supine position. No bruits or organomegaly, bowel sounds nl  MS:  warm without deformities, calf  tenderness, cyanosis or clubbing  SKIN: warm and dry without lesions    NEURO:  alert, approp, no deficits    cxr 03/03/13 wnl   Sinus CT 04/29/13 Maxillary sinuses: Bilateral air-fluid levels indicating acuity.  Mucosal thickening is associated laterally and superiorly.      Assessment & Plan:

## 2013-05-11 NOTE — Patient Instructions (Addendum)
Augmentin 875 mg take one pill twice daily  X 10 days - take at breakfast and supper with large glass of water.  It would help reduce the usual side effects (diarrhea and yeast infections) if you ate cultured yogurt at lunch.   Ok to try off acid suppression now to see if cough flares  Please remember to go to the lab  department downstairs for your tests - we will call you with the results when they are available and decide whether you need additional allergy testing at this point

## 2013-05-12 ENCOUNTER — Telehealth: Payer: Self-pay | Admitting: Internal Medicine

## 2013-05-12 ENCOUNTER — Encounter: Payer: Self-pay | Admitting: Internal Medicine

## 2013-05-12 LAB — ALLERGY PROFILE REGION II-DC, DE, MD, ~~LOC~~, VA
Allergen, D pternoyssinus,d7: <0.1 kU/L
Alternaria Alternata: <0.1 kU/L
Bermuda Grass: <0.1 kU/L
Box Elder IgE: <0.1 kU/L
Cat Dander: <0.1 kU/L
Cladosporium Herbarum: <0.1 kU/L
Cockroach: <0.1 kU/L
D. farinae: <0.1 kU/L
Dog Dander: <0.1 kU/L
IgE (Immunoglobulin E), Serum: 5.1 IU/mL (ref 0.0–180.0)
Meadow Grass: <0.1 kU/L
Oak: <0.1 kU/L
Pecan/Hickory Tree IgE: <0.1 kU/L

## 2013-05-12 NOTE — Telephone Encounter (Signed)
Pt aware of recs and needed nothing further

## 2013-05-12 NOTE — Assessment & Plan Note (Signed)
-   Sinus CT 04/29/2013 > Chronic and acute sinusitis> augmentin bid x 20 days then repeat sinus ct if/when flares

## 2013-05-12 NOTE — Progress Notes (Signed)
Quick Note:  Advised pt of labs per MW. Pt verbalized understand ______

## 2013-05-12 NOTE — Telephone Encounter (Signed)
Spoke with pt about lab result and pt wanting to know next step.  States MW was going to send to ENT or Allergist based on lab results. Labs were normal.  Please advise what you would like done with pt.  Pt also states is to cancel appt 05/25/13 per MW didn't need f/u OV.

## 2013-05-12 NOTE — Assessment & Plan Note (Signed)
-   Sinus CT 04/29/2013 > Chronic and acute sinusitis> augmentin bid x 10 days then ov - Allergy profile 05/11/2013 > 5.9% eos,  IgE 5  Most likely she has chronic sinusitis with pnds triggering cough triggering gerd.  Will extend the augmentin x 10 more days and drop the gerd rx to see when/  if cough flares keeping in mind Chronic cough is often simultaneously caused by more than one condition. A single cause has been found from 38 to 82% of the time, multiple causes from 18 to 62%. Multiply caused cough has been the result of three diseases up to 42% of the time.   If flares only p stop augmentin then needs repeat sinus ct and ent eval    Each maintenance medication was reviewed in detail including most importantly the difference between maintenance and as needed and under what circumstances the prns are to be used.  Please see instructions for details which were reviewed in writing and the patient given a copy.

## 2013-05-12 NOTE — Telephone Encounter (Signed)
Since allergy eval is neg no need to consider allergist and rec  is to treat with 10 more days augmentin while holding the acid suppression to sort out to what extent she has 2 problems vs 1  If cough flares off gerd rx, restart  If cough flares p finish augmentin > repeat sinus ct and then decide whether ent needed

## 2013-05-12 NOTE — Telephone Encounter (Signed)
lmomtcb x1 for pt on named VM.  

## 2013-05-25 ENCOUNTER — Ambulatory Visit: Payer: BC Managed Care – PPO

## 2013-05-25 ENCOUNTER — Ambulatory Visit: Payer: BC Managed Care – PPO | Admitting: Internal Medicine

## 2013-06-08 ENCOUNTER — Other Ambulatory Visit: Payer: Self-pay | Admitting: Family Medicine

## 2013-07-18 ENCOUNTER — Other Ambulatory Visit: Payer: Self-pay | Admitting: Internal Medicine

## 2013-07-18 DIAGNOSIS — R05 Cough: Secondary | ICD-10-CM

## 2013-07-18 MED ORDER — PANTOPRAZOLE SODIUM 40 MG PO TBEC: 40.0000 mg | DELAYED_RELEASE_TABLET | Freq: Every day | ORAL | Status: DC

## 2013-09-11 ENCOUNTER — Other Ambulatory Visit: Payer: Self-pay | Admitting: Family Medicine

## 2013-09-12 ENCOUNTER — Ambulatory Visit (INDEPENDENT_AMBULATORY_CARE_PROVIDER_SITE_OTHER): Payer: BC Managed Care – PPO | Admitting: Nurse Practitioner

## 2013-09-12 VITALS — BP 110/70 | HR 74 | Temp 97.2°F

## 2013-09-12 DIAGNOSIS — J0100 Acute maxillary sinusitis, unspecified: Secondary | ICD-10-CM

## 2013-09-12 MED ORDER — AMOXICILLIN-POT CLAVULANATE 875-125 MG PO TABS: 1.0000 | ORAL_TABLET | Freq: Two times a day (BID) | ORAL | Status: DC

## 2013-09-12 NOTE — Progress Notes (Signed)
   Subjective:    Patient ID: Susan Whitney, female    DOB: [birth year], 53 y.o.   MRN: [id number]  HPI Patient in c/o ears stopped up and cogestion and drainage- Feels tired- slight cough    Review of Systems  Constitutional: Positive for fatigue. Negative for fever, chills and appetite change.  HENT: Positive for congestion, postnasal drip, rhinorrhea and sinus pressure. Negative for ear discharge, ear pain, sore throat and trouble swallowing.   Respiratory: Positive for cough (slight dry cough).        Objective:   Physical Exam  Constitutional: She is oriented to person, place, and time. She appears well-developed and well-nourished.  HENT:  Right Ear: Hearing, tympanic membrane, external ear and ear canal normal.  Left Ear: Hearing, tympanic membrane, external ear and ear canal normal.  Nose: Mucosal edema and rhinorrhea present. Right sinus exhibits maxillary sinus tenderness. Right sinus exhibits no frontal sinus tenderness. Left sinus exhibits maxillary sinus tenderness.  Mouth/Throat: Uvula is midline, oropharynx is clear and moist and mucous membranes are normal.  Eyes: Conjunctivae are normal. Pupils are equal, round, and reactive to light.  Neck: Normal range of motion. Neck supple.  Cardiovascular: Normal rate, regular rhythm and normal heart sounds.   Pulmonary/Chest: Effort normal and breath sounds normal.  Neurological: She is alert and oriented to person, place, and time.  Psychiatric: She has a normal mood and affect. Her behavior is normal. Judgment and thought content normal.    BP 110/70  Pulse 74  Temp(Src) 97.2 F (36.2 C) (Oral)       Assessment & Plan:  1. Sinusitis, acute maxillary Meds ordered this encounter  Medications  . amoxicillin-clavulanate (AUGMENTIN) 875-125 MG per tablet    Sig: Take 1 tablet by mouth 2 (two) times daily.    Dispense:  20 tablet    Refill:  0    Order Specific Question:  Supervising Provider    Answer:  Chipper Herb  [[id number]]   1. Take meds as prescribed 2. Use a cool mist humidifier especially during the winter months and when heat has been humid. 3. Use saline nose sprays frequently 4. Saline irrigations of the nose can be very helpful if done frequently.  * 4X daily for 1 week*  * Use of a nettie pot can be helpful with this. Follow directions with this* 5. Drink plenty of fluids 6. Keep thermostat turn down low 7.For any cough or congestion  Use plain Mucinex- regular strength or max strength is fine   * Children- consult with Pharmacist for dosing 8. For fever or aces or pains- take tylenol or ibuprofen appropriate for age and weight.  * for fevers greater than 101 orally you may alternate ibuprofen and tylenol every  3 hours.   Mary-Margaret Hassell Done, FNP

## 2013-09-12 NOTE — Patient Instructions (Signed)

## 2013-10-09 IMAGING — CR DG CHEST 2V
2 series · 2 of 2 positions shown · non-contrast
Comparison: None.

CLINICAL DATA: Cough

CHEST - 2 VIEW

[view not recorded (1 of 2)]
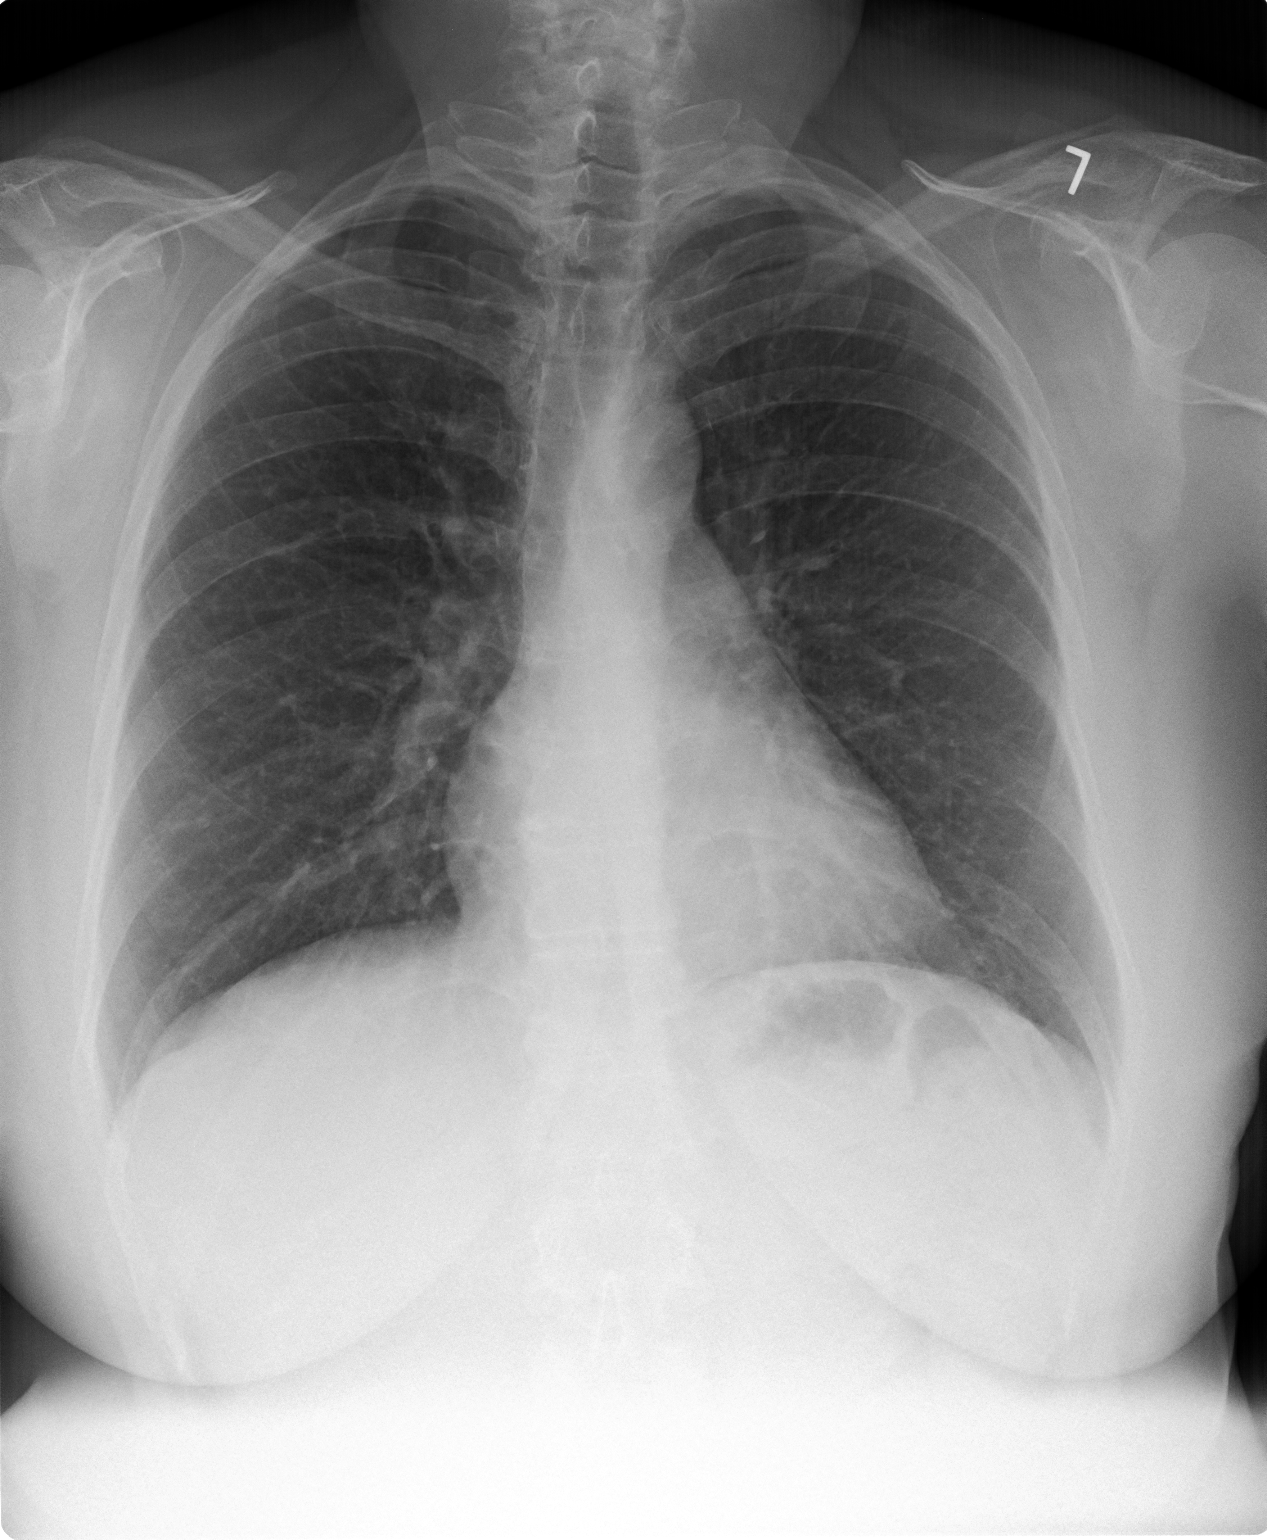

[view not recorded (2 of 2)]
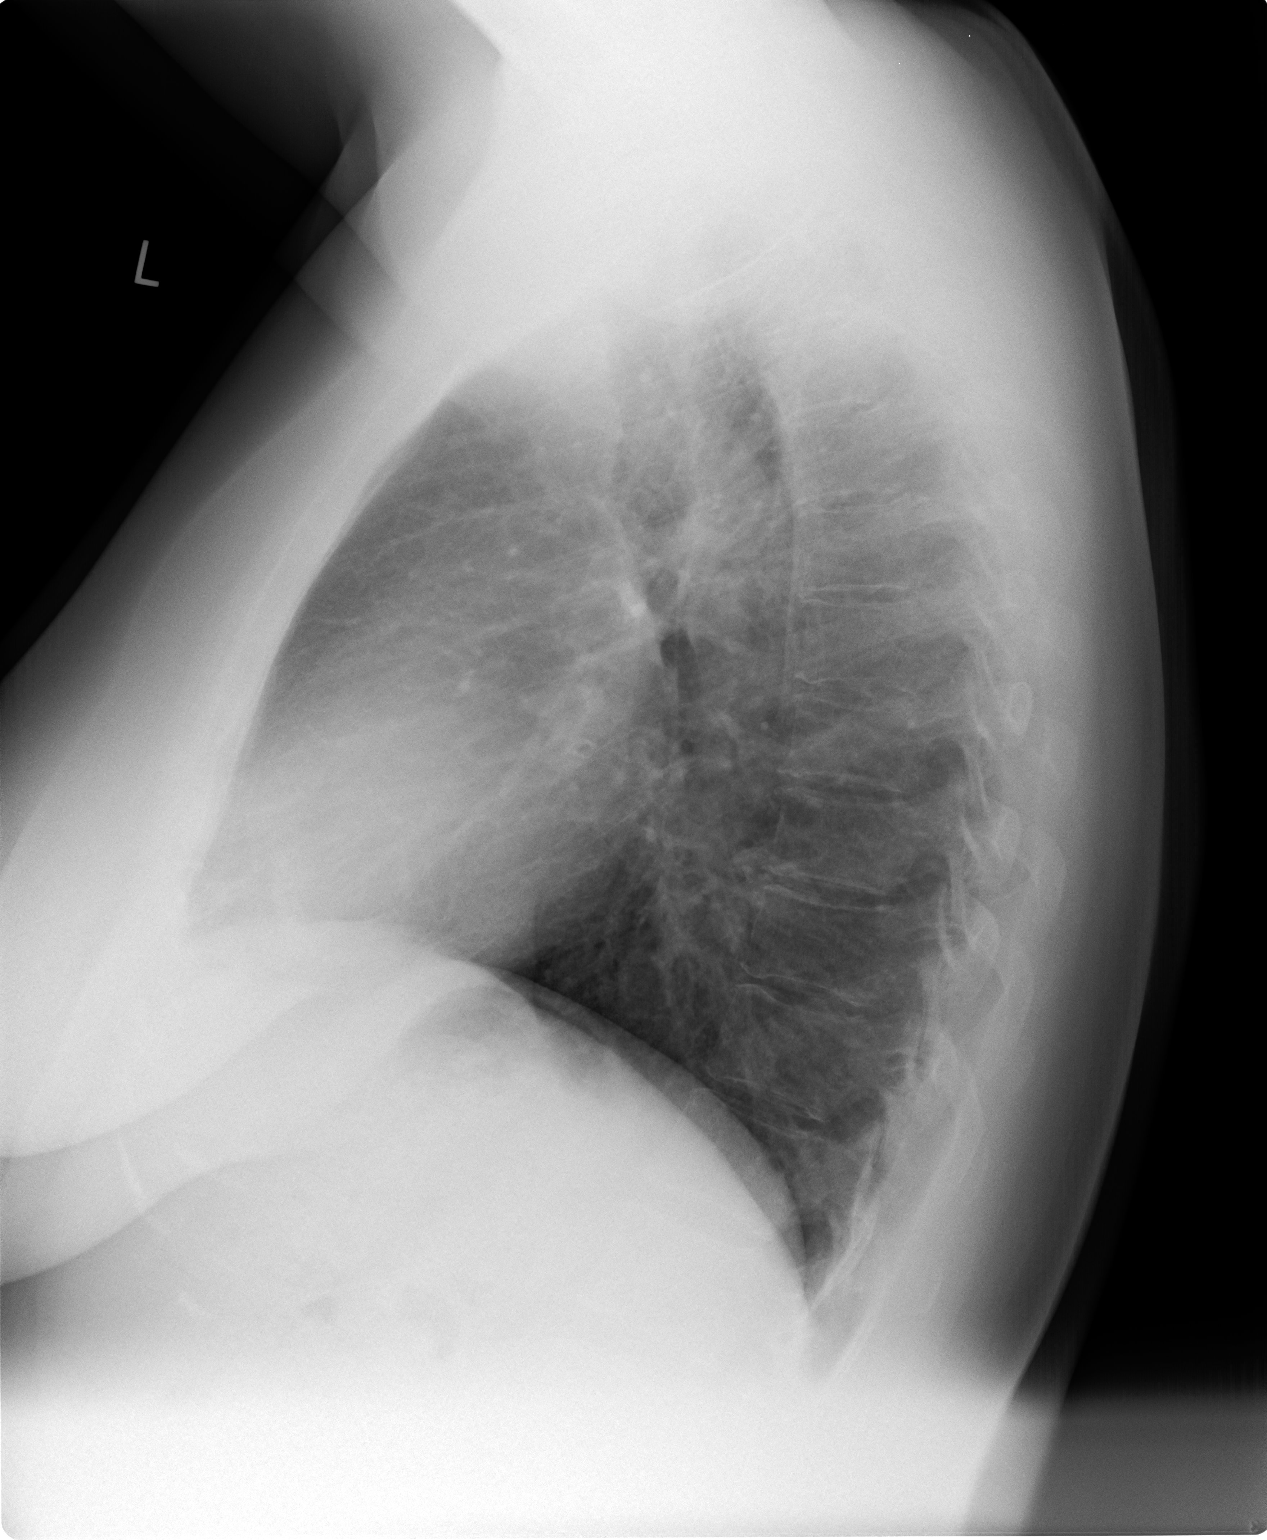

[2 of 2 positions shown; findings below may reference images not displayed]

FINDINGS: Lungs clear.  Heart size and pulmonary vascularity are
normal.  No adenopathy.  No bone lesions.
IMPRESSION: No abnormality noted.

## 2013-11-15 ENCOUNTER — Other Ambulatory Visit: Payer: Self-pay | Admitting: *Deleted

## 2013-11-15 MED ORDER — EST ESTROGENS-METHYLTEST 0.625-1.25 MG PO TABS: 1.0000 | ORAL_TABLET | Freq: Every day | ORAL | Status: DC

## 2013-12-13 ENCOUNTER — Other Ambulatory Visit: Payer: Self-pay

## 2013-12-13 MED ORDER — SERTRALINE HCL 50 MG PO TABS: ORAL_TABLET | ORAL | Status: DC

## 2014-02-14 ENCOUNTER — Other Ambulatory Visit: Payer: Self-pay | Admitting: Nurse Practitioner

## 2014-02-15 NOTE — Telephone Encounter (Signed)
Last ov 2/15. 

## 2014-02-19 ENCOUNTER — Other Ambulatory Visit: Payer: Self-pay | Admitting: Nurse Practitioner

## 2014-04-10 ENCOUNTER — Ambulatory Visit: Payer: Self-pay | Admitting: Nurse Practitioner

## 2014-06-19 ENCOUNTER — Other Ambulatory Visit: Payer: Self-pay | Admitting: Nurse Practitioner

## 2014-07-21 HISTORY — PX: SINUS IRRIGATION: SHX2411

## 2014-08-14 ENCOUNTER — Telehealth: Payer: Self-pay | Admitting: Nurse Practitioner

## 2014-08-14 MED ORDER — AZITHROMYCIN 250 MG PO TABS: ORAL_TABLET | ORAL | Status: DC

## 2014-08-14 NOTE — Telephone Encounter (Signed)
Patient aware rx sent  To pharmacy

## 2014-08-14 NOTE — Telephone Encounter (Signed)
zpak sent to pharmacy 

## 2014-08-24 ENCOUNTER — Telehealth: Payer: Self-pay | Admitting: Nurse Practitioner

## 2014-08-24 DIAGNOSIS — J329 Chronic sinusitis, unspecified: Secondary | ICD-10-CM

## 2014-08-24 NOTE — Telephone Encounter (Signed)
Referral made 

## 2014-09-08 ENCOUNTER — Other Ambulatory Visit: Payer: Self-pay | Admitting: Nurse Practitioner

## 2014-09-08 NOTE — Telephone Encounter (Signed)
Please call in xanax with 1 refills 

## 2014-09-09 NOTE — Telephone Encounter (Signed)
Alprazolam called in to pharmacy

## 2014-09-19 HISTORY — PX: FOOT NEUROMA SURGERY: SHX646

## 2014-11-04 ENCOUNTER — Other Ambulatory Visit: Payer: Self-pay | Admitting: Nurse Practitioner

## 2014-12-07 ENCOUNTER — Other Ambulatory Visit: Payer: Self-pay | Admitting: Otolaryngology

## 2015-01-15 ENCOUNTER — Other Ambulatory Visit: Payer: Self-pay | Admitting: Nurse Practitioner

## 2015-01-15 NOTE — Telephone Encounter (Signed)
Last seen 09/12/14  MMM

## 2015-02-28 ENCOUNTER — Other Ambulatory Visit (INDEPENDENT_AMBULATORY_CARE_PROVIDER_SITE_OTHER): Payer: BLUE CROSS/BLUE SHIELD

## 2015-02-28 ENCOUNTER — Other Ambulatory Visit: Payer: Self-pay | Admitting: Family Medicine

## 2015-02-28 DIAGNOSIS — Z780 Asymptomatic menopausal state: Secondary | ICD-10-CM

## 2015-03-14 ENCOUNTER — Other Ambulatory Visit: Payer: Self-pay | Admitting: Nurse Practitioner

## 2015-03-14 NOTE — Telephone Encounter (Signed)
Last seen 09/12/14 MMM

## 2015-03-28 ENCOUNTER — Encounter: Payer: Self-pay | Admitting: Pharmacist

## 2015-03-28 ENCOUNTER — Ambulatory Visit (INDEPENDENT_AMBULATORY_CARE_PROVIDER_SITE_OTHER): Payer: BLUE CROSS/BLUE SHIELD | Admitting: Pharmacist

## 2015-03-28 VITALS — Ht 65.0 in | Wt 194.5 lb

## 2015-03-28 DIAGNOSIS — M8580 Other specified disorders of bone density and structure, unspecified site: Secondary | ICD-10-CM | POA: Diagnosis not present

## 2015-03-28 NOTE — Progress Notes (Signed)
Patient ID: Susan Whitney, female   DOB: 1961-02-20, 54 y.o.   MRN: [id number]  [facility] Current Height: Height: 5\' 5"  (165.1 cm)      Max Lifetime Height:  5\' 5"  Current Weight: Weight: 194 lb 8 oz (88.225 kg)       Ethnicity:Caucasian    HPI: Does pt already have a diagnosis of:  Osteopenia?  Yes Osteoporosis?  No  Back Pain?  No       Kyphosis?  No Prior fracture?  No Med(s) for Osteoporosis/Osteopenia:  none Med(s) previously tried for Osteoporosis/Osteopenia:  none                                                             PMH: Age at menopause:  sugical Hysterectomy?  Yes Oophorectomy?  No HRT? Yes - Current.  Type/duration: estra test Steroid Use?  No Thyroid med?  No History of cancer?  No History of digestive disorders (ie Crohn's)?  No Current or previous eating disorders?  No Last Vitamin D Result:  needed Last GFR Result:  Has been over 2 years   FH/SH: Family history of osteoporosis?  No  Parent with history of hip fracture?  Yes - mother  Family history of breast cancer?  No Exercise?  No Smoking?  No Alcohol?  No    Calcium Assessment Calcium Intake  # of servings/day  Calcium mg  Milk (8 oz) 0  x  300  = 0  Yogurt (4 oz) 0 x  200 = 0  Cheese (1 oz) 0 x  200 = 0  Other Calcium sources   250mg   Ca supplement 0 = 0   Estimated calcium intake per day 250mg     DEXA Results Date of Test T-Score for AP Spine L1-L4 T-Score for Total Left Hip T-Score for Total Right Hip  02/28/2015 -2.4 -1.7 -2.2  10/02/2003 -1.8 -1.8 --  06/21/2001 -1.9 -1.8 --  06/10/1999 -2.0 -1.8 --   FRAX 10 year estimate: Total FX risk:  14%  (consider medication if >/= 20%) Hip FX risk:  1.4%  (consider medication if >/= 3%)  Assessment: Osteopenia - worsening BMD but not high risk per FRAX estimate  Recommendations: 1.  Continue with estrogen replacement 2.  recommend calcium 1200mg  daily through supplementation and diet.  3.  recommend weight bearing exercise  - 30 minutes at least 4 days per week.   4.  Counseled and educated about fall risk and prevention.  Recheck DEXA:  1 year  Time spent counseling patient:  20 minutes  Cherre Robins, PharmD, CPP

## 2015-03-28 NOTE — Patient Instructions (Signed)
Exercise for Strong Bones  Exercise is important to build and maintain strong bones / bone density.  There are 2 types of exercises that are important to building and maintaining strong bones:  Weight- bearing and muscle-stregthening.  Weight-bearing Exercises  These exercises include activities that make you move against gravity while staying upright. Weight-bearing exercises can be high-impact or low-impact.  High-impact weight-bearing exercises help build bones and keep them strong. If you have broken a bone due to osteoporosis or are at risk of breaking a bone, you may need to avoid high-impact exercises. If you're not sure, you should check with your healthcare provider.  Examples of high-impact weight-bearing exercises are: Dancing  Doing high-impact aerobics  Hiking  Jogging/running  Jumping Rope  Stair climbing  Tennis  Low-impact weight-bearing exercises can also help keep bones strong and are a safe alternative if you cannot do high-impact exercises.   Examples of low-impact weight-bearing exercises are: Using elliptical training machines  Doing low-impact aerobics  Using stair-step machines  Fast walking on a treadmill or outside   Muscle-Strengthening Exercises These exercises include activities where you move your body, a weight or some other resistance against gravity. They are also known as resistance exercises and include: Lifting weights  Using elastic exercise bands  Using weight machines  Lifting your own body weight  Functional movements, such as standing and rising up on your toes  Yoga and Pilates can also improve strength, balance and flexibility. However, certain positions may not be safe for people with osteoporosis or those at increased risk of broken bones. For example, exercises that have you bend forward may increase the chance of breaking a bone in the spine.   Non-Impact Exercises There are other types of exercises that can help  prevent falls.  Non-impact exercises can help you to improve balance, posture and how well you move in everyday activities. Some of these exercises include: Balance exercises that strengthen your legs and test your balance, such as Tai Chi, can decrease your risk of falls.  Posture exercises that improve your posture and reduce rounded or "sloping" shoulders can help you decrease the chance of breaking a bone, especially in the spine.  Functional exercises that improve how well you move can help you with everyday activities and decrease your chance of falling and breaking a bone. For example, if you have trouble getting up from a chair or climbing stairs, you should do these activities as exercises.   **A physical therapist can teach you balance, posture and functional exercises. He/she can also help you learn which exercises are safe and appropriate for you.  [facility] has a physical therapy office in [place] in front of our office and referrals can be made for assessments and treatment as needed and strength and balance training.  If you would like to have an assessment with Chad and our physical therapy team please let a nurse or provider know.   Calcium & Vitamin D: The Facts  Why is calcium and vitamin D consumption important? Calcium: . Most Americans do not consume adequate amounts of calcium! Calcium is required for proper muscle function, nerve communication, bone support, and many other functions in the body.  . The body uses bones as a source of calcium. Bones 'remodel' themselves continuously - the body constantly breaks bone down to release calcium and rebuilds bones by replacing calcium in the bone later.  . As we get older, the rate of bone breakdown occurs faster than bone rebuilding which   could lead to osteopenia, osteoporosis, and possible fractures.   Vitamin D: . People naturally make vitamin D in the body when sunlight hits the skin and triggers a process that leads to  vitamin D production. This natural vitamin D production requires about 10-15 minutes of sun exposure on the hands, arms, and face at least 2-3 times per week. However, due to decreased sun exposure and the use of sunscreen, most people will need to get additional vitamin D from foods or supplements. Your doctor can measure your body's vitamin D level through a simple blood test to determine your daily vitamin D needs.  . Vitamin D is used to help the body absorb calcium, maintain bone health, help the immune system, and reduce inflammation. It also plays a role in muscle performance, balance and risk of falling.  . Vitamin D deficiency can lead to osteomalacia or softening of the bones, bone pain, and muscle weakness.   The recommended daily allowance of Calcium and Vitamin D varies for different age groups. Age group Calcium (mg) Vitamin D (IU)  Females and Males: Age 35-50 1000 mg 600 IU  Females: Age 23- 43 1200 mg 600 IU  Males: Age 86-70 1000 mg 600 IU  Females and Males: Age 18+ 1200 mg 800 IU  Pregnant/lactating Females age 33-50 1000 mg 600 IU   How much Calcium do you get in your diet? Calcium Intake # of servings per day  Total calcium (mg)  Skim milk, 2% milk (1 cup) _________ x 300 mg   Yogurt (1 small container) _________ x 200 mg   Cheese (1oz) _________ x 200 mg   Cottage Cheese (1 cup)             ________ x 150 mg   Almond milk (1 cup) _________ x 450 mg   Fortified Orange Juice (1 cup) _________ x 300 mg   Broccoli or spinach ( 1 cup) _________ x 100 mg   Salmon (3 oz) _________ x 150 mg    Almonds (1/4 cup) _______ x 90 mg      How do we get Calcium and Vitamin D in our diet? Calcium: . Obtaining calcium from the diet is the most preferred way to reach the recommended daily goal. If this goal is not reached through diet, calcium supplements are available.  . Calcium is found in many foods including: dairy products, dark leafy vegetables (like broccoli, kale, and  spinach), fish, and fortified products like juices and cereals.  . The food label will have a %DV (percent daily value) listed showing the amount of calcium per serving. To determine the total mg per serving, simply replace the % with zero (0).  For example, Almond Breeze almond milk contains 45% DV of calcium or 471m per 1 cup.  . You can increase the amount of calcium in your diet by using more calcium products in your daily meals. Use yogurt and fruit to make smoothies or use yogurt to top baked potatoes or make whipped potatoes. Sprinkle low fat cheese onto salads or into egg white omelets. You can even add non-fat dry milk powder (3039mcalcium per 1/3 cup) to hot cereals, meat loaf, soups, or potatoes.  . Calcium supplements come in many forms including tablets, chewables, and gummies. Be sure to read the label to determine the correct number of tablets per serving and whether or not to take the supplement with food.  . Calcium carbonate products (Oscal, Caltrate, and Viactiv) are generally better  absorbed when taken with food while calcium citrate products like Citracal can be taken with or without food.  . The body can only absorb about 600 mg of calcium at one time. It is recommended to take calcium supplements in small amounts several times per day.  However, taking it all at once is better than not taking it at all. . Increasing your intake of calcium is essential for bone health, but may also lead to some side effects like constipation, increased gas, bloating or abdominal cramping. To help reduce these side effects, start with 1 tablet per day and slowly increase your intake of the supplement to the recommended doses. It is also recommended that you drink plenty of water each day. Vitamin D: . Very few foods naturally contain vitamin D. However, it is found in saltwater fish (like tuna, salmon and mackerel), beef liver, egg yolks, cheese and vitamin D fortified foods (like yogurt, cereals,  orange juice and milk) . The amount of vitamin D in each food or product is listed as %DV on the product label. To determine the total amount of vitamin D per serving, drop the % sign and multiply the number by 4. For example, 1 cup of Almond Breeze almond milk contains 25% DV vitamin D or 100 IU per serving (25 x 4 =100). . Vitamin D is also found in multivitamins and supplements and may be listed as ergocalciferol (vitamin D2) or cholecalciferol (vitamin D3). Each of these forms of vitamin D are equivalent and the daily recommended intake will vary based on your age and the vitamin D levels in your body. Follow your doctor's recommendation for vitamin D intake.      Fall Prevention and Home Safety Falls cause injuries and can affect all age groups. It is possible to use preventive measures to significantly decrease the likelihood of falls. There are many simple measures which can make your home safer and prevent falls. OUTDOORS  Repair cracks and edges of walkways and driveways.  Remove high doorway thresholds.  Trim shrubbery on the main path into your home.  Have good outside lighting.  Clear walkways of tools, rocks, debris, and clutter.  Check that handrails are not broken and are securely fastened. Both sides of steps should have handrails.  Have leaves, snow, and ice cleared regularly.  Use sand or salt on walkways during winter months.  In the garage, clean up grease or oil spills. BATHROOM  Install night lights.  Install grab bars by the toilet and in the tub and shower.  Use non-skid mats or decals in the tub or shower.  Place a plastic non-slip stool in the shower to sit on, if needed.  Keep floors dry and clean up all water on the floor immediately.  Remove soap buildup in the tub or shower on a regular basis.  Secure bath mats with non-slip, double-sided rug tape.  Remove throw rugs and tripping hazards from the floors. BEDROOMS  Install night  lights.  Make sure a bedside light is easy to reach.  Do not use oversized bedding.  Keep a telephone by your bedside.  Have a firm chair with side arms to use for getting dressed.  Remove throw rugs and tripping hazards from the floor. KITCHEN  Keep handles on pots and pans turned toward the center of the stove. Use back burners when possible.  Clean up spills quickly and allow time for drying.  Avoid walking on wet floors.  Avoid hot utensils and knives.    Position shelves so they are not too high or low.  Place commonly used objects within easy reach.  If necessary, use a sturdy step stool with a grab bar when reaching.  Keep electrical cables out of the way.  Do not use floor polish or wax that makes floors slippery. If you must use wax, use non-skid floor wax.  Remove throw rugs and tripping hazards from the floor. STAIRWAYS  Never leave objects on stairs.  Place handrails on both sides of stairways and use them. Fix any loose handrails. Make sure handrails on both sides of the stairways are as long as the stairs.  Check carpeting to make sure it is firmly attached along stairs. Make repairs to worn or loose carpet promptly.  Avoid placing throw rugs at the top or bottom of stairways, or properly secure the rug with carpet tape to prevent slippage. Get rid of throw rugs, if possible.  Have an electrician put in a light switch at the top and bottom of the stairs. OTHER FALL PREVENTION TIPS  Wear low-heel or rubber-soled shoes that are supportive and fit well. Wear closed toe shoes.  When using a stepladder, make sure it is fully opened and both spreaders are firmly locked. Do not climb a closed stepladder.  Add color or contrast paint or tape to grab bars and handrails in your home. Place contrasting color strips on first and last steps.  Learn and use mobility aids as needed. Install an electrical emergency response system.  Turn on lights to avoid dark areas.  Replace light bulbs that burn out immediately. Get light switches that glow.  Arrange furniture to create clear pathways. Keep furniture in the same place.  Firmly attach carpet with non-skid or double-sided tape.  Eliminate uneven floor surfaces.  Select a carpet pattern that does not visually hide the edge of steps.  Be aware of all pets. OTHER HOME SAFETY TIPS  Set the water temperature for 120 F (48.8 C).  Keep emergency numbers on or near the telephone.  Keep smoke detectors on every level of the home and near sleeping areas. Document Released: 06/27/2002 Document Revised: 01/06/2012 Document Reviewed: 09/26/2011 [facility] Patient Information 2015 [place], [place]. This information is not intended to replace advice given to you by your health care provider. Make sure you discuss any questions you have with your health care provider.

## 2015-03-29 ENCOUNTER — Other Ambulatory Visit (INDEPENDENT_AMBULATORY_CARE_PROVIDER_SITE_OTHER): Payer: BLUE CROSS/BLUE SHIELD

## 2015-03-29 DIAGNOSIS — M8580 Other specified disorders of bone density and structure, unspecified site: Secondary | ICD-10-CM

## 2015-03-29 NOTE — Progress Notes (Signed)
Lab only 

## 2015-03-30 ENCOUNTER — Other Ambulatory Visit: Payer: Self-pay | Admitting: Pharmacist

## 2015-03-30 ENCOUNTER — Encounter: Payer: Self-pay | Admitting: Pharmacist

## 2015-03-30 LAB — CMP14+EGFR
A/G RATIO: 1.4 (ref 1.1–2.5)
ALT: 14 IU/L (ref 0–32)
AST: 18 IU/L (ref 0–40)
Albumin: 4.2 g/dL (ref 3.5–5.5)
Alkaline Phosphatase: 90 IU/L (ref 39–117)
BUN/Creatinine Ratio: 15 (ref 9–23)
BUN: 10 mg/dL (ref 6–24)
Bilirubin Total: 0.2 mg/dL (ref 0.0–1.2)
CALCIUM: 9.3 mg/dL (ref 8.7–10.2)
CO2: 24 mmol/L (ref 18–29)
CREATININE: 0.65 mg/dL (ref 0.57–1.00)
Chloride: 101 mmol/L (ref 97–108)
GFR, EST AFRICAN AMERICAN: 117 mL/min/{1.73_m2} (ref >59)
GFR, EST NON AFRICAN AMERICAN: 102 mL/min/{1.73_m2} (ref >59)
GLUCOSE: 94 mg/dL (ref 65–99)
Globulin, Total: 2.9 g/dL (ref 1.5–4.5)
Potassium: 4.5 mmol/L (ref 3.5–5.2)
Sodium: 140 mmol/L (ref 134–144)
TOTAL PROTEIN: 7.1 g/dL (ref 6.0–8.5)

## 2015-03-30 LAB — VITAMIN D 25 HYDROXY (VIT D DEFICIENCY, FRACTURES): VIT D 25 HYDROXY: 22.5 ng/mL — AB (ref 30.0–100.0)

## 2015-03-30 LAB — PTH, INTACT AND CALCIUM: PTH: 34 pg/mL (ref 15–65)

## 2015-03-30 LAB — TSH: TSH: 2.32 u[IU]/mL (ref 0.450–4.500)

## 2015-03-30 MED ORDER — VITAMIN D (ERGOCALCIFEROL) 1.25 MG (50000 UNIT) PO CAPS: 50000.0000 [IU] | ORAL_CAPSULE | ORAL | Status: DC

## 2015-04-10 ENCOUNTER — Encounter: Payer: Self-pay | Admitting: Nurse Practitioner

## 2015-04-10 ENCOUNTER — Ambulatory Visit (INDEPENDENT_AMBULATORY_CARE_PROVIDER_SITE_OTHER): Payer: BLUE CROSS/BLUE SHIELD

## 2015-04-10 ENCOUNTER — Ambulatory Visit (INDEPENDENT_AMBULATORY_CARE_PROVIDER_SITE_OTHER): Payer: BLUE CROSS/BLUE SHIELD | Admitting: Nurse Practitioner

## 2015-04-10 VITALS — BP 107/72 | HR 64 | Temp 96.8°F | Ht 65.0 in | Wt 193.2 lb

## 2015-04-10 DIAGNOSIS — Z0000 Encounter for general adult medical examination without abnormal findings: Secondary | ICD-10-CM | POA: Diagnosis not present

## 2015-04-10 DIAGNOSIS — Z1211 Encounter for screening for malignant neoplasm of colon: Secondary | ICD-10-CM

## 2015-04-10 DIAGNOSIS — Z6832 Body mass index (BMI) 32.0-32.9, adult: Secondary | ICD-10-CM | POA: Insufficient documentation

## 2015-04-10 DIAGNOSIS — F329 Major depressive disorder, single episode, unspecified: Secondary | ICD-10-CM

## 2015-04-10 DIAGNOSIS — F32A Depression, unspecified: Secondary | ICD-10-CM

## 2015-04-10 MED ORDER — SERTRALINE HCL 50 MG PO TABS: 50.0000 mg | ORAL_TABLET | Freq: Every day | ORAL | Status: DC

## 2015-04-10 NOTE — Progress Notes (Signed)
   Subjective:    Patient ID: Susan Whitney, female    DOB: [birth year], 54 y.o.   MRN: [id number]  HPI Patient here today for complete physical exam. She is doing well today- Has been on weight watchers for several months and has lost over 25 lbs. Her only current medical problem is depression in which she takes zoloft for. SHe had no side effects from medication.    Review of Systems  Constitutional: Negative.   HENT: Negative.   Respiratory: Negative.   Cardiovascular: Negative.   Gastrointestinal: Negative.   Genitourinary: Negative.   Neurological: Negative.   Psychiatric/Behavioral: Negative.   All other systems reviewed and are negative.      Objective:   Physical Exam  Constitutional: She is oriented to person, place, and time. She appears well-developed and well-nourished.  HENT:  Nose: Nose normal.  Mouth/Throat: Oropharynx is clear and moist.  Eyes: EOM are normal.  Neck: Trachea normal, normal range of motion and full passive range of motion without pain. Neck supple. No JVD present. Carotid bruit is not present. No thyromegaly present.  Cardiovascular: Normal rate, regular rhythm, normal heart sounds and intact distal pulses.  Exam reveals no gallop and no friction rub.   No murmur heard. Pulmonary/Chest: Effort normal and breath sounds normal.  Abdominal: Soft. Bowel sounds are normal. She exhibits no distension and no mass. There is no tenderness.  Musculoskeletal: Normal range of motion.  Lymphadenopathy:    She has no cervical adenopathy.  Neurological: She is alert and oriented to person, place, and time. She has normal reflexes.  Skin: Skin is warm and dry.  Psychiatric: She has a normal mood and affect. Her behavior is normal. Judgment and thought content normal.   BP 107/72 mmHg  Pulse 64  Temp(Src) 96.8 F (36 C) (Oral)  Ht $R'5\' 5"'pv$  (1.651 m)  Wt 193 lb 3.2 oz (87.635 kg)  BMI 32.15 kg/m2  Chest x ray- normal- no cardiopulmonary disease-Preliminary  reading by Ronnald Collum, FNP  [facility]   EKG- NSR-Preliminary reading by Ronnald Collum, FNP  [facility]        Assessment & Plan:  1. BMI 32.0-32.9,adult Continue weight watchers Will recheck weight in 3-6 months   2. Depression Stress management - sertraline (ZOLOFT) 50 MG tablet; Take 1 tablet (50 mg total) by mouth daily.  Dispense: 30 tablet; Refill: 5  3. Annual physical exam - CMP14+EGFR - Lipid panel - CBC with Differential/Platelet - Thyroid Panel With TSH - DG Chest 2 View; Future - EKG 12-Lead  4. Encounter for screening colonoscopy - Ambulatory referral to Gastroenterology    Labs pending Health maintenance reviewed Diet and exercise encouraged Continue all meds Follow up  In 1 year    Mary-Margaret Hassell Done, FNP

## 2015-04-10 NOTE — Patient Instructions (Signed)
Exercise to Stay Healthy Exercise helps you become and stay healthy. EXERCISE IDEAS AND TIPS Choose exercises that:  You enjoy.  Fit into your day. You do not need to exercise really hard to be healthy. You can do exercises at a slow or medium level and stay healthy. You can:  Stretch before and after working out.  Try yoga, Pilates, or tai chi.  Lift weights.  Walk fast, swim, jog, run, climb stairs, bicycle, dance, or rollerskate.  Take aerobic classes. Exercises that burn about 150 calories:  Running 1  miles in 15 minutes.  Playing volleyball for 45 to 60 minutes.  Washing and waxing a car for 45 to 60 minutes.  Playing touch football for 45 minutes.  Walking 1  miles in 35 minutes.  Pushing a stroller 1  miles in 30 minutes.  Playing basketball for 30 minutes.  Raking leaves for 30 minutes.  Bicycling 5 miles in 30 minutes.  Walking 2 miles in 30 minutes.  Dancing for 30 minutes.  Shoveling snow for 15 minutes.  Swimming laps for 20 minutes.  Walking up stairs for 15 minutes.  Bicycling 4 miles in 15 minutes.  Gardening for 30 to 45 minutes.  Jumping rope for 15 minutes.  Washing windows or floors for 45 to 60 minutes. Document Released: 08/09/2010 Document Revised: 09/29/2011 Document Reviewed: 08/09/2010 [facility] Patient Information 2015 [place], [place]. This information is not intended to replace advice given to you by your health care provider. Make sure you discuss any questions you have with your health care provider.

## 2015-04-11 LAB — CBC WITH DIFFERENTIAL/PLATELET
BASOS: 1 %
Basophils Absolute: 0.1 10*3/uL (ref 0.0–0.2)
EOS (ABSOLUTE): 0.6 10*3/uL — ABNORMAL HIGH (ref 0.0–0.4)
EOS: 6 %
HEMATOCRIT: 41.9 % (ref 34.0–46.6)
Hemoglobin: 13.8 g/dL (ref 11.1–15.9)
IMMATURE GRANS (ABS): 0 10*3/uL (ref 0.0–0.1)
IMMATURE GRANULOCYTES: 0 %
LYMPHS: 26 %
Lymphocytes Absolute: 2.5 10*3/uL (ref 0.7–3.1)
MCH: 28.3 pg (ref 26.6–33.0)
MCHC: 32.9 g/dL (ref 31.5–35.7)
MCV: 86 fL (ref 79–97)
MONOCYTES: 5 %
Monocytes Absolute: 0.5 10*3/uL (ref 0.1–0.9)
NEUTROS PCT: 62 %
Neutrophils Absolute: 6.1 10*3/uL (ref 1.4–7.0)
Platelets: 375 10*3/uL (ref 150–379)
RBC: 4.87 x10E6/uL (ref 3.77–5.28)
RDW: 15.4 % (ref 12.3–15.4)
WBC: 9.9 10*3/uL (ref 3.4–10.8)

## 2015-04-11 LAB — LIPID PANEL
CHOLESTEROL TOTAL: 162 mg/dL (ref 100–199)
Chol/HDL Ratio: 3.8 ratio units (ref 0.0–4.4)
HDL: 43 mg/dL (ref >39)
LDL Calculated: 107 mg/dL — ABNORMAL HIGH (ref 0–99)
Triglycerides: 58 mg/dL (ref 0–149)
VLDL CHOLESTEROL CAL: 12 mg/dL (ref 5–40)

## 2015-04-11 LAB — CMP14+EGFR
A/G RATIO: 1.6 (ref 1.1–2.5)
ALBUMIN: 4.6 g/dL (ref 3.5–5.5)
ALK PHOS: 89 IU/L (ref 39–117)
ALT: 10 IU/L (ref 0–32)
AST: 15 IU/L (ref 0–40)
BILIRUBIN TOTAL: 0.3 mg/dL (ref 0.0–1.2)
BUN / CREAT RATIO: 14 (ref 9–23)
BUN: 10 mg/dL (ref 6–24)
CHLORIDE: 99 mmol/L (ref 97–108)
CO2: 25 mmol/L (ref 18–29)
Calcium: 9.5 mg/dL (ref 8.7–10.2)
Creatinine, Ser: 0.7 mg/dL (ref 0.57–1.00)
GFR calc Af Amer: 114 mL/min/{1.73_m2} (ref >59)
GFR calc non Af Amer: 99 mL/min/{1.73_m2} (ref >59)
GLUCOSE: 88 mg/dL (ref 65–99)
Globulin, Total: 2.8 g/dL (ref 1.5–4.5)
POTASSIUM: 4.3 mmol/L (ref 3.5–5.2)
SODIUM: 140 mmol/L (ref 134–144)
Total Protein: 7.4 g/dL (ref 6.0–8.5)

## 2015-04-11 LAB — THYROID PANEL WITH TSH
FREE THYROXINE INDEX: 1.8 (ref 1.2–4.9)
T3 UPTAKE RATIO: 26 % (ref 24–39)
T4 TOTAL: 6.9 ug/dL (ref 4.5–12.0)
TSH: 1.74 u[IU]/mL (ref 0.450–4.500)

## 2015-05-16 ENCOUNTER — Other Ambulatory Visit: Payer: Self-pay

## 2015-05-16 DIAGNOSIS — F32A Depression, unspecified: Secondary | ICD-10-CM

## 2015-05-16 DIAGNOSIS — F329 Major depressive disorder, single episode, unspecified: Secondary | ICD-10-CM

## 2015-05-16 MED ORDER — SERTRALINE HCL 50 MG PO TABS
50.0000 mg | ORAL_TABLET | Freq: Every day | ORAL | Status: DC
Start: 2015-05-16 — End: 2015-08-15

## 2015-08-10 ENCOUNTER — Telehealth: Payer: Self-pay | Admitting: Nurse Practitioner

## 2015-08-10 MED ORDER — AZITHROMYCIN 250 MG PO TABS: ORAL_TABLET | ORAL | Status: DC

## 2015-08-10 NOTE — Telephone Encounter (Signed)
zpak sent to pharmacy 

## 2015-08-15 ENCOUNTER — Other Ambulatory Visit: Payer: Self-pay | Admitting: Nurse Practitioner

## 2015-08-24 ENCOUNTER — Telehealth: Payer: Self-pay | Admitting: Nurse Practitioner

## 2015-08-24 ENCOUNTER — Other Ambulatory Visit: Payer: Self-pay | Admitting: Nurse Practitioner

## 2015-08-24 MED ORDER — EST ESTROGENS-METHYLTEST 0.625-1.25 MG PO TABS: 1.0000 | ORAL_TABLET | Freq: Every day | ORAL | Status: DC

## 2015-08-24 NOTE — Telephone Encounter (Signed)
Premarin rx sent to pharmacy.

## 2015-08-24 NOTE — Telephone Encounter (Signed)
Patient aware.

## 2015-08-27 ENCOUNTER — Other Ambulatory Visit: Payer: Self-pay | Admitting: Pharmacist

## 2015-08-27 DIAGNOSIS — R7989 Other specified abnormal findings of blood chemistry: Secondary | ICD-10-CM

## 2015-08-28 ENCOUNTER — Other Ambulatory Visit (INDEPENDENT_AMBULATORY_CARE_PROVIDER_SITE_OTHER): Payer: BLUE CROSS/BLUE SHIELD

## 2015-08-28 DIAGNOSIS — R7989 Other specified abnormal findings of blood chemistry: Secondary | ICD-10-CM

## 2015-08-28 NOTE — Progress Notes (Signed)
Lab only 

## 2015-08-29 ENCOUNTER — Other Ambulatory Visit: Payer: Self-pay | Admitting: Pharmacist

## 2015-08-29 DIAGNOSIS — E559 Vitamin D deficiency, unspecified: Secondary | ICD-10-CM

## 2015-08-29 LAB — VITAMIN D 25 HYDROXY (VIT D DEFICIENCY, FRACTURES): Vit D, 25-Hydroxy: 25.8 ng/mL — ABNORMAL LOW (ref 30.0–100.0)

## 2015-08-29 MED ORDER — VITAMIN D (ERGOCALCIFEROL) 1.25 MG (50000 UNIT) PO CAPS: 50000.0000 [IU] | ORAL_CAPSULE | ORAL | Status: DC

## 2015-10-05 ENCOUNTER — Ambulatory Visit (INDEPENDENT_AMBULATORY_CARE_PROVIDER_SITE_OTHER): Payer: BLUE CROSS/BLUE SHIELD | Admitting: Nurse Practitioner

## 2015-10-05 ENCOUNTER — Encounter: Payer: Self-pay | Admitting: Nurse Practitioner

## 2015-10-05 VITALS — BP 111/69 | HR 83 | Temp 98.2°F | Ht 65.0 in | Wt 180.0 lb

## 2015-10-05 DIAGNOSIS — R059 Cough, unspecified: Secondary | ICD-10-CM

## 2015-10-05 DIAGNOSIS — J0101 Acute recurrent maxillary sinusitis: Secondary | ICD-10-CM | POA: Diagnosis not present

## 2015-10-05 DIAGNOSIS — R05 Cough: Secondary | ICD-10-CM

## 2015-10-05 MED ORDER — METHYLPREDNISOLONE ACETATE 80 MG/ML IJ SUSP
80.0000 mg | Freq: Once | INTRAMUSCULAR | Status: AC
  Administered 2015-10-05: 80 mg via INTRAMUSCULAR

## 2015-10-05 MED ORDER — AZITHROMYCIN 250 MG PO TABS: ORAL_TABLET | ORAL | Status: DC

## 2015-10-05 NOTE — Patient Instructions (Signed)

## 2015-10-05 NOTE — Progress Notes (Signed)
   Subjective:    Patient ID: Susan Whitney, female    DOB: 10-Nov-1960, 55 y.o.   MRN: [id number]  HPI Patient comes in c/o of scratchy throat- achy all over- pain with coughing. No fever. Patient has to sing  This weekend and needs to be better.    Review of Systems  Constitutional: Negative for fever and chills.  HENT: Positive for congestion, sinus pressure, sore throat, trouble swallowing and voice change. Negative for ear pain.   Respiratory: Positive for cough.   Cardiovascular: Negative.   Gastrointestinal: Negative.   Genitourinary: Negative.   Neurological: Negative.   Psychiatric/Behavioral: Negative.   All other systems reviewed and are negative.      Objective:   Physical Exam  Constitutional: She is oriented to person, place, and time. She appears well-developed and well-nourished. No distress.  HENT:  Right Ear: Hearing, tympanic membrane, external ear and ear canal normal.  Left Ear: Hearing, tympanic membrane, external ear and ear canal normal.  Nose: Mucosal edema and rhinorrhea present. Right sinus exhibits maxillary sinus tenderness. Right sinus exhibits no frontal sinus tenderness. Left sinus exhibits maxillary sinus tenderness. Left sinus exhibits no frontal sinus tenderness.  Mouth/Throat: Uvula is midline, oropharynx is clear and moist and mucous membranes are normal.  Cardiovascular: Normal rate, regular rhythm and normal heart sounds.   Pulmonary/Chest: Effort normal and breath sounds normal.  Neurological: She is alert and oriented to person, place, and time.  Skin: Skin is warm.  Psychiatric: She has a normal mood and affect. Her behavior is normal. Judgment and thought content normal.   BP 111/69 mmHg  Pulse 83  Temp(Src) 98.2 F (36.8 C) (Oral)  Ht 5\' 5"  (1.651 m)  Wt 180 lb (81.647 kg)  BMI 29.95 kg/m2       Assessment & Plan:   1. Cough   2. Acute recurrent maxillary sinusitis    Meds ordered this encounter  Medications  . azithromycin  (ZITHROMAX Z-PAK) 250 MG tablet    Sig: As directed    Dispense:  1 each    Refill:  0    Order Specific Question:  Supervising Provider    Answer:  Chipper Herb [[id number]]  . methylPREDNISolone acetate (DEPO-MEDROL) injection 80 mg    Sig:    1. Take meds as prescribed 2. Use a cool mist humidifier especially during the winter months and when heat has been humid. 3. Use saline nose sprays frequently 4. Saline irrigations of the nose can be very helpful if done frequently.  * 4X daily for 1 week*  * Use of a nettie pot can be helpful with this. Follow directions with this* 5. Drink plenty of fluids 6. Keep thermostat turn down low 7.For any cough or congestion  Use plain Mucinex- regular strength or max strength is fine   * Children- consult with Pharmacist for dosing 8. For fever or aces or pains- take tylenol or ibuprofen appropriate for age and weight.  * for fevers greater than 101 orally you may alternate ibuprofen and tylenol every  3 hours.   Mary-Margaret Hassell Done, FNP

## 2015-10-16 ENCOUNTER — Other Ambulatory Visit: Payer: Self-pay | Admitting: Nurse Practitioner

## 2015-10-16 MED ORDER — ALPRAZOLAM 0.25 MG PO TABS: 0.2500 mg | ORAL_TABLET | Freq: Every evening | ORAL | Status: DC | PRN

## 2015-10-16 NOTE — Telephone Encounter (Signed)
Please call in xanax [phone] 1 po qd prn #[phone]  with 1 refills 

## 2015-10-16 NOTE — Telephone Encounter (Signed)
Phoned in and pt aware 

## 2015-11-11 ENCOUNTER — Other Ambulatory Visit: Payer: Self-pay | Admitting: Nurse Practitioner

## 2015-12-03 ENCOUNTER — Other Ambulatory Visit: Payer: Self-pay | Admitting: Nurse Practitioner

## 2016-02-23 ENCOUNTER — Ambulatory Visit: Payer: BLUE CROSS/BLUE SHIELD

## 2016-03-27 ENCOUNTER — Other Ambulatory Visit: Payer: BLUE CROSS/BLUE SHIELD

## 2016-03-27 ENCOUNTER — Other Ambulatory Visit: Payer: Self-pay | Admitting: Nurse Practitioner

## 2016-03-27 ENCOUNTER — Ambulatory Visit (INDEPENDENT_AMBULATORY_CARE_PROVIDER_SITE_OTHER): Payer: BLUE CROSS/BLUE SHIELD

## 2016-03-27 DIAGNOSIS — M899 Disorder of bone, unspecified: Secondary | ICD-10-CM | POA: Diagnosis not present

## 2016-03-27 DIAGNOSIS — M8580 Other specified disorders of bone density and structure, unspecified site: Secondary | ICD-10-CM

## 2016-06-02 ENCOUNTER — Other Ambulatory Visit: Payer: Self-pay | Admitting: Nurse Practitioner

## 2016-06-03 NOTE — Telephone Encounter (Signed)
Please call in xanax with 1 refills 

## 2016-06-04 NOTE — Telephone Encounter (Signed)
RX called in .

## 2016-06-05 ENCOUNTER — Other Ambulatory Visit: Payer: Self-pay | Admitting: Nurse Practitioner

## 2016-08-07 ENCOUNTER — Telehealth: Payer: Self-pay | Admitting: *Deleted

## 2016-08-07 DIAGNOSIS — R05 Cough: Secondary | ICD-10-CM

## 2016-08-07 DIAGNOSIS — R059 Cough, unspecified: Secondary | ICD-10-CM

## 2016-08-07 DIAGNOSIS — J0101 Acute recurrent maxillary sinusitis: Secondary | ICD-10-CM

## 2016-08-07 MED ORDER — AZITHROMYCIN 250 MG PO TABS: ORAL_TABLET | ORAL | 0 refills | Status: DC

## 2016-08-07 NOTE — Telephone Encounter (Signed)
Aware ,script ready. 

## 2016-08-07 NOTE — Telephone Encounter (Signed)
Has a sore throat and low grade fever. Wants to know if we can send a [facility] to [facility] [place] since we are closed. Feels like she has a golf ball in her throat.

## 2016-08-07 NOTE — Telephone Encounter (Signed)
zpak Prescription sent to pharmacy  ° °

## 2016-09-05 ENCOUNTER — Other Ambulatory Visit: Payer: Self-pay | Admitting: Nurse Practitioner

## 2016-09-23 ENCOUNTER — Other Ambulatory Visit: Payer: Self-pay

## 2016-09-23 DIAGNOSIS — Z1211 Encounter for screening for malignant neoplasm of colon: Secondary | ICD-10-CM

## 2016-12-04 ENCOUNTER — Other Ambulatory Visit: Payer: Self-pay | Admitting: Nurse Practitioner

## 2016-12-17 ENCOUNTER — Ambulatory Visit: Payer: BLUE CROSS/BLUE SHIELD | Admitting: Physician Assistant

## 2016-12-25 ENCOUNTER — Ambulatory Visit (INDEPENDENT_AMBULATORY_CARE_PROVIDER_SITE_OTHER): Payer: BLUE CROSS/BLUE SHIELD | Admitting: Nurse Practitioner

## 2016-12-25 ENCOUNTER — Encounter: Payer: Self-pay | Admitting: Nurse Practitioner

## 2016-12-25 VITALS — BP 118/78 | HR 63 | Temp 96.7°F | Ht 65.0 in | Wt 180.0 lb

## 2016-12-25 DIAGNOSIS — M26622 Arthralgia of left temporomandibular joint: Secondary | ICD-10-CM

## 2016-12-25 MED ORDER — NAPROXEN 500 MG PO TABS: 500.0000 mg | ORAL_TABLET | Freq: Two times a day (BID) | ORAL | 1 refills | Status: DC

## 2016-12-25 MED ORDER — CYCLOBENZAPRINE HCL 5 MG PO TABS: 5.0000 mg | ORAL_TABLET | Freq: Three times a day (TID) | ORAL | 1 refills | Status: DC | PRN

## 2016-12-25 NOTE — Patient Instructions (Signed)
Temporomandibular Joint Syndrome Temporomandibular joint (TMJ) syndrome is a condition that affects the joints between your jaw and your skull. The TMJs are located near your ears and allow your jaw to open and close. These joints and the nearby muscles are involved in all movements of the jaw. People with TMJ syndrome have pain in the area of these joints and muscles. Chewing, biting, or other movements of the jaw can be difficult or painful. TMJ syndrome can be caused by various things. In many cases, the condition is mild and goes away within a few weeks. For some people, the condition can become a long-term problem. What are the causes? Possible causes of TMJ syndrome include:  Grinding your teeth or clenching your jaw. Some people do this when they are under stress.  Arthritis.  Injury to the jaw.  Head or neck injury.  Teeth or dentures that are not aligned well.  In some cases, the cause of TMJ syndrome may not be known. What are the signs or symptoms? The most common symptom is an aching pain on the side of the head in the area of the TMJ. Other symptoms may include:  Pain when moving your jaw, such as when chewing or biting.  Being unable to open your jaw all the way.  Making a clicking sound when you open your mouth.  Headache.  Earache.  Neck or shoulder pain.  How is this diagnosed? Diagnosis can usually be made based on your symptoms, your medical history, and a physical exam. Your health care provider may check the range of motion of your jaw. Imaging tests, such as X-rays or an MRI, are sometimes done. You may need to see your dentist to determine if your teeth and jaw are lined up correctly. How is this treated? TMJ syndrome often goes away on its own. If treatment is needed, the options may include:  Eating soft foods and applying ice or heat.  Medicines to relieve pain or inflammation.  Medicines to relax the muscles.  A splint, bite plate, or mouthpiece  to prevent teeth grinding or jaw clenching.  Relaxation techniques or counseling to help reduce stress.  Transcutaneous electrical nerve stimulation (TENS). This helps to relieve pain by applying an electrical current through the skin.  Acupuncture. This is sometimes helpful to relieve pain.  Jaw surgery. This is rarely needed.  Follow these instructions at home:  Take medicines only as directed by your health care provider.  Eat a soft diet if you are having trouble chewing.  Apply ice to the painful area. ? Put ice in a plastic bag. ? Place a towel between your skin and the bag. ? Leave the ice on for 20 minutes, 2-3 times a day.  Apply a warm compress to the painful area as directed.  Massage your jaw area and perform any jaw stretching exercises as recommended by your health care provider.  If you were given a mouthpiece or bite plate, wear it as directed.  Avoid foods that require a lot of chewing. Do not chew gum.  Keep all follow-up visits as directed by your health care provider. This is important. Contact a health care provider if:  You are having trouble eating.  You have new or worsening symptoms. Get help right away if:  Your jaw locks open or closed. This information is not intended to replace advice given to you by your health care provider. Make sure you discuss any questions you have with your health care provider. Document   Released: 04/01/2001 Document Revised: 03/06/2016 Document Reviewed: 02/09/2014 [employer] Interactive Patient Education  2018 [employer]  

## 2016-12-25 NOTE — Progress Notes (Signed)
   Subjective:    Patient ID: Susan Whitney, female    DOB: August 11, 1960, 56 y.o.   MRN: [id number]  HPI  Patient comes in today c/o left sided jaw pain- started about 3 weeks ago. Is intermittent but occurs daily. Seems to be worse in mornings. Chewing increases pain. Denies grinding teeth at night. Denies tooth abscess.   Review of Systems  Constitutional: Negative.   HENT: Negative for facial swelling.   Respiratory: Negative.   Cardiovascular: Negative.   Genitourinary: Negative.   Neurological: Negative.   Psychiatric/Behavioral: Negative.   All other systems reviewed and are negative.      Objective:   Physical Exam  Constitutional: She is oriented to person, place, and time. She appears well-developed and well-nourished. No distress.  HENT:  Pain left TMJ with opening and closing of mouth No abscess tooth visible  Cardiovascular: Normal rate and regular rhythm.   Pulmonary/Chest: Effort normal and breath sounds normal.  Neurological: She is alert and oriented to person, place, and time.  Skin: Skin is warm.  Psychiatric: She has a normal mood and affect. Her behavior is normal. Judgment and thought content normal.   BP 118/78   Pulse 63   Temp (!) 96.7 F (35.9 C) (Oral)   Ht 5\' 5"  (1.651 m)   Wt 180 lb (81.6 kg)   BMI 29.95 kg/m        Assessment & Plan:  1. Arthralgia of left temporomandibular joint Ice if helps Avoid foods that require a lot of chewing - cyclobenzaprine (FLEXERIL) 5 MG tablet; Take 1 tablet (5 mg total) by mouth 3 (three) times daily as needed for muscle spasms.  Dispense: 30 tablet; Refill: 1 - naproxen (NAPROSYN) 500 MG tablet; Take 1 tablet (500 mg total) by mouth 2 (two) times daily with a meal.  Dispense: 60 tablet; Refill: [place], FNP

## 2017-01-28 ENCOUNTER — Encounter: Payer: Self-pay | Admitting: Vascular Surgery

## 2017-02-10 ENCOUNTER — Encounter: Payer: Self-pay | Admitting: Vascular Surgery

## 2017-02-10 ENCOUNTER — Ambulatory Visit (INDEPENDENT_AMBULATORY_CARE_PROVIDER_SITE_OTHER): Payer: BLUE CROSS/BLUE SHIELD | Admitting: Vascular Surgery

## 2017-02-10 VITALS — BP 113/77 | HR 83 | Temp 98.5°F | Resp 18 | Ht 65.0 in | Wt 202.9 lb

## 2017-02-10 DIAGNOSIS — I83893 Varicose veins of bilateral lower extremities with other complications: Secondary | ICD-10-CM

## 2017-02-10 NOTE — Progress Notes (Signed)
Subjective:     Patient ID: Susan Whitney, female   DOB: [birth year], 56 y.o.   MRN: [id number]  HPI This 56 year old female is referred by Dr. Allyn Kenner for evaluation of bilateral painful varicose veins. She has had no previous treatment. She has hypersensitivity, itching, and burning discomfort left worse than right. The worst areas in the pretibial region on the left leg. She will develop some mild swelling as the day progresses. She has no history of DVT thrombophlebitis stasis ulcers or bleeding. She does not elastic compression stockings.  Past Medical History:  Diagnosis Date  . Anemia   . Anxiety   . Osteopenia   . RLS (restless legs syndrome)     Social History  Substance Use Topics  . Smoking status: Never Smoker  . Smokeless tobacco: Never Used  . Alcohol use No    Family History  Problem Relation Age of Onset  . Emphysema Mother        smoked  . Hip fracture Mother   . Colon cancer Father   . Hypertension Sister   . Hypertension Brother     No Known Allergies   Current Outpatient Prescriptions:  .  ALPRAZolam (XANAX) 0.25 MG tablet, TAKE 1 TABLET BY MOUTH DAILY AS NEEDED FOR ANXIETY, Disp: 30 tablet, Rfl: 0 .  cyclobenzaprine (FLEXERIL) 5 MG tablet, Take 1 tablet (5 mg total) by mouth 3 (three) times daily as needed for muscle spasms., Disp: 30 tablet, Rfl: 1 .  naproxen (NAPROSYN) 500 MG tablet, Take 1 tablet (500 mg total) by mouth 2 (two) times daily with a meal. (Patient not taking: Reported on 02/10/2017), Disp: 60 tablet, Rfl: 1 .  sertraline (ZOLOFT) 50 MG tablet, TAKE 1 TABLET (50 MG TOTAL) BY MOUTH DAILY. (Patient not taking: Reported on 02/10/2017), Disp: 90 tablet, Rfl: 0  Vitals:   02/10/17 1313  BP: 113/77  Pulse: 83  Resp: 18  Temp: 98.5 F (36.9 C)  TempSrc: Oral  SpO2: 96%  Weight: 202 lb 14.4 oz (92 kg)  Height: 5\' 5"  (1.651 m)    Body mass index is 33.76 kg/m.         Review of Systems Denies chest pain, dyspnea on  exertion, PND, orthopnea, hemoptysis, claudication    Objective:   Physical Exam BP 113/77 (BP Location: Left Arm, Patient Position: Sitting, Cuff Size: Normal)   Pulse 83   Temp 98.5 F (36.9 C) (Oral)   Resp 18   Ht 5\' 5"  (1.651 m)   Wt 202 lb 14.4 oz (92 kg)   SpO2 96%   BMI 33.76 kg/m     Gen.-alert and oriented x3 in no apparent distress HEENT normal for age Lungs no rhonchi or wheezing Cardiovascular regular rhythm no murmurs carotid pulses 3+ palpable no bruits audible Abdomen soft nontender no palpable masses Musculoskeletal free of  major deformities Skin clear -no rashes Neurologic normal Lower extremities 3+ femoral and dorsalis pedis pulses palpable bilaterally with no edema Bilateral diffuse spider veins worse in the medial and lateral thigh areas and in the left pretibial region over about a 6 cm in diameter area. Also reticular and spider veins in the malleolar areas mostly medial. No hyperpigmentation or ulceration noted. No bulging varicosities noted.  Today I performed a bedside SonoSite ultrasound exam which revealed normal size great saphenous veins bilaterally with no evidence of reflux       Assessment:     #1 bilateral spider veins-symptomatic    Plan:  Discussed with patient the fact that treatment would consist of foam sclerotherapy and this may or may not relieve her leg symptoms. She did discuss this further with Susan Whitney and will decide if she would like to proceed. Otherwise return to see [place] on a when necessary basis

## 2017-03-13 ENCOUNTER — Other Ambulatory Visit: Payer: Self-pay | Admitting: Nurse Practitioner

## 2017-04-08 ENCOUNTER — Ambulatory Visit (INDEPENDENT_AMBULATORY_CARE_PROVIDER_SITE_OTHER): Payer: Self-pay | Admitting: *Deleted

## 2017-04-08 ENCOUNTER — Encounter: Payer: Self-pay | Admitting: *Deleted

## 2017-04-08 DIAGNOSIS — I83893 Varicose veins of bilateral lower extremities with other complications: Secondary | ICD-10-CM

## 2017-04-08 NOTE — Progress Notes (Signed)
X=.3% Sotradecol administered with a 27g butterfly.  Patient received a total of 12cc.  Treated majority of areas with 2 syringes. Easy access. Tol well. Scheduled her for another tx in Jan to get the rest. Hoping for good results for this nice lady.    Compression stockings applied: Yes.  and ace over left calf.

## 2017-06-02 ENCOUNTER — Ambulatory Visit: Payer: BLUE CROSS/BLUE SHIELD | Admitting: Nurse Practitioner

## 2017-06-02 ENCOUNTER — Encounter: Payer: Self-pay | Admitting: Nurse Practitioner

## 2017-06-02 VITALS — BP 113/74 | HR 85 | Temp 98.4°F | Ht 65.0 in | Wt 202.0 lb

## 2017-06-02 DIAGNOSIS — J029 Acute pharyngitis, unspecified: Secondary | ICD-10-CM | POA: Diagnosis not present

## 2017-06-02 MED ORDER — METHYLPREDNISOLONE ACETATE 80 MG/ML IJ SUSP
80.0000 mg | Freq: Once | INTRAMUSCULAR | Status: AC
  Administered 2017-06-02: 80 mg via INTRAMUSCULAR

## 2017-06-02 NOTE — Patient Instructions (Signed)
Force fluids °Motrin or tylenol OTC °OTC decongestant °Throat lozenges if help °New toothbrush in 3 days ° °

## 2017-06-02 NOTE — Progress Notes (Signed)
   Subjective:    Patient ID: Susan Whitney, female    DOB: Feb 02, 1961, 56 y.o.   MRN: [id number]  HPI Patient comes in today c/o sore throat and achiness. No fever. Started yesterday morning. Pain got worse throughout yesterday and last night. Feels like golfball in throat.    Review of Systems  Constitutional: Negative.  Negative for chills and fever.  HENT: Positive for postnasal drip, sore throat, trouble swallowing and voice change. Negative for congestion and ear pain.   Respiratory: Negative for cough.   Cardiovascular: Negative.   Gastrointestinal: Negative.   Genitourinary: Negative.   Neurological: Negative.   Psychiatric/Behavioral: Negative.   All other systems reviewed and are negative.      Objective:   Physical Exam  Constitutional: She is oriented to person, place, and time. She appears well-developed and well-nourished. No distress.  HENT:  Right Ear: Hearing, tympanic membrane, external ear and ear canal normal.  Left Ear: Hearing, tympanic membrane, external ear and ear canal normal.  Nose: Mucosal edema and rhinorrhea present. Right sinus exhibits no maxillary sinus tenderness and no frontal sinus tenderness. Left sinus exhibits no maxillary sinus tenderness and no frontal sinus tenderness.  Mouth/Throat: Uvula is midline. Posterior oropharyngeal edema (1+ tonsils bil) and posterior oropharyngeal erythema present.  Neck: Normal range of motion. Neck supple.  Cardiovascular: Normal rate and regular rhythm.  Pulmonary/Chest: Effort normal and breath sounds normal.  Lymphadenopathy:    She has no cervical adenopathy.  Neurological: She is alert and oriented to person, place, and time.  Skin: Skin is warm and dry.  Psychiatric: She has a normal mood and affect. Her behavior is normal. Judgment and thought content normal.    BP 113/74   Pulse 85   Temp 98.4 F (36.9 C) (Oral)   Ht 5\' 5"  (1.651 m)   Wt 202 lb (91.6 kg)   BMI 33.61 kg/m     Assessment & Plan:    1. Pharyngitis, unspecified etiology    Meds ordered this encounter  Medications  . methylPREDNISolone acetate (DEPO-MEDROL) injection 80 mg   Force fluids Motrin or tylenol OTC OTC decongestant Throat lozenges if help New toothbrush in 3 days  Mary-Margaret Hassell Done, FNP

## 2017-06-05 ENCOUNTER — Telehealth: Payer: Self-pay | Admitting: Nurse Practitioner

## 2017-06-05 MED ORDER — AMOXICILLIN-POT CLAVULANATE 875-125 MG PO TABS: 1.0000 | ORAL_TABLET | Freq: Two times a day (BID) | ORAL | 0 refills | Status: DC

## 2017-06-05 NOTE — Telephone Encounter (Signed)
Patient notified

## 2017-06-05 NOTE — Telephone Encounter (Signed)
AUGMENTIN SENT TO [facility]ARM[facility]

## 2017-06-20 ENCOUNTER — Other Ambulatory Visit: Payer: Self-pay | Admitting: Nurse Practitioner

## 2017-06-29 ENCOUNTER — Other Ambulatory Visit: Payer: Self-pay | Admitting: Nurse Practitioner

## 2017-07-29 ENCOUNTER — Ambulatory Visit (INDEPENDENT_AMBULATORY_CARE_PROVIDER_SITE_OTHER): Payer: Self-pay | Admitting: *Deleted

## 2017-07-29 DIAGNOSIS — I83893 Varicose veins of bilateral lower extremities with other complications: Secondary | ICD-10-CM

## 2017-07-29 NOTE — Progress Notes (Signed)
X=.3% Sotradecol administered with a 27g butterfly.  Patient received a total of 12cc.  Retreated areas that are still open. Tol well. Not great closure probably due to her ankle being smaller than her thighs. Used aces last time, but told her to use them or knee highs for the entire 2 week period. Follow prn.  Photos: Yes.    Compression stockings applied: Yes.

## 2017-08-04 ENCOUNTER — Encounter: Payer: Self-pay | Admitting: *Deleted

## 2017-08-10 ENCOUNTER — Ambulatory Visit (INDEPENDENT_AMBULATORY_CARE_PROVIDER_SITE_OTHER): Payer: BLUE CROSS/BLUE SHIELD

## 2017-08-10 ENCOUNTER — Encounter: Payer: Self-pay | Admitting: Nurse Practitioner

## 2017-08-10 ENCOUNTER — Ambulatory Visit: Payer: BLUE CROSS/BLUE SHIELD | Admitting: Nurse Practitioner

## 2017-08-10 ENCOUNTER — Other Ambulatory Visit: Payer: Self-pay | Admitting: Nurse Practitioner

## 2017-08-10 VITALS — BP 122/75 | HR 72 | Temp 97.0°F | Ht 65.0 in | Wt 202.0 lb

## 2017-08-10 DIAGNOSIS — M25521 Pain in right elbow: Secondary | ICD-10-CM

## 2017-08-10 DIAGNOSIS — M79621 Pain in right upper arm: Secondary | ICD-10-CM | POA: Diagnosis not present

## 2017-08-10 DIAGNOSIS — M25511 Pain in right shoulder: Secondary | ICD-10-CM

## 2017-08-10 DIAGNOSIS — W19XXXA Unspecified fall, initial encounter: Secondary | ICD-10-CM

## 2017-08-10 NOTE — Progress Notes (Signed)
   Subjective:    Patient ID: Susan Whitney, female    DOB: 01/28/1961, 57 y.o.   MRN: [id number]  HPI Patient come sin today c/o of fall. She fell in her attic while cleaning and her foot went through a hole in the floor. She hurt her right arm when she fell. Yesterday she was unable  To bend her elbow due to pain. Feels some better this morning.    Review of Systems  Constitutional: Negative.   Respiratory: Negative.   Cardiovascular: Negative.   Musculoskeletal: Positive for arthralgias (right elbow pain).  Neurological: Negative.   Psychiatric/Behavioral: Negative.   All other systems reviewed and are negative.      Objective:   Physical Exam  Constitutional: She is oriented to person, place, and time.  Musculoskeletal:  FROM of right elbow without pain. Pain on palpation of right upper arm- no contusion.  Neurological: She is alert and oriented to person, place, and time. She has normal reflexes.   BP 122/75   Pulse 72   Temp (!) 97 F (36.1 C) (Oral)   Ht 5\' 5"  (1.651 m)   Wt 202 lb (91.6 kg)   BMI 33.61 kg/m   Right shoulder and elbow xray - negative-Preliminary reading by Ronnald Collum, FNP  [facility]     1. Right elbow pain   2. Pain in right upper arm    Rest Motrin or tylenol OTC RTO prn  Mary-Margaret Hassell Done, FNP

## 2017-09-18 ENCOUNTER — Ambulatory Visit: Payer: BLUE CROSS/BLUE SHIELD | Admitting: Physician Assistant

## 2017-09-18 ENCOUNTER — Ambulatory Visit (INDEPENDENT_AMBULATORY_CARE_PROVIDER_SITE_OTHER): Payer: BLUE CROSS/BLUE SHIELD

## 2017-09-18 ENCOUNTER — Encounter: Payer: Self-pay | Admitting: Physician Assistant

## 2017-09-18 VITALS — BP 126/84 | HR 80 | Temp 97.1°F | Ht 65.0 in | Wt 204.4 lb

## 2017-09-18 DIAGNOSIS — M545 Low back pain, unspecified: Secondary | ICD-10-CM

## 2017-09-18 DIAGNOSIS — G8929 Other chronic pain: Secondary | ICD-10-CM | POA: Diagnosis not present

## 2017-09-18 NOTE — Patient Instructions (Signed)

## 2017-09-18 NOTE — Progress Notes (Signed)
BP 126/84   Pulse 80   Temp (!) 97.1 F (36.2 C) (Oral)   Ht 5\' 5"  (1.651 m)   Wt 204 lb 6.4 oz (92.7 kg)   BMI 34.01 kg/m    Subjective:    Patient ID: Susan Whitney, female    DOB: 09-15-1960, 57 y.o.   MRN: [id number]  HPI: Susan Whitney is a 57 y.o. female presenting on 09/18/2017 for Back Pain (low back pain and side pain )  Patient reports that for about 6 months she has had chronic midline lumbar pain.  It is at the lumbosacral area.  It does not radiate down either leg.  Footing.  She did not have an injury to her back.  It is painful when she gets up in the morning.  She is a little while it does seem to loosen up.  She is able to take Tylenol or ibuprofen and keep her calm down.  She does not know of any degenerative disc problems that she is ever had.  Past Medical History:  Diagnosis Date  . Anemia   . Anxiety   . Osteopenia   . RLS (restless legs syndrome)    Relevant past medical, surgical, family and social history reviewed and updated as indicated. Interim medical history since our last visit reviewed. Allergies and medications reviewed and updated. DATA REVIEWED: CHART IN EPIC  Family History reviewed for pertinent findings.  Review of Systems  Constitutional: Negative.   HENT: Negative.   Eyes: Negative.   Respiratory: Negative.   Gastrointestinal: Negative.   Genitourinary: Negative.   Musculoskeletal: Positive for back pain and myalgias. Negative for joint swelling.    Allergies as of 09/18/2017   No Known Allergies     Medication List        Accurate as of 09/18/17  2:11 PM. Always use your most recent med list.          ALPRAZolam 0.25 MG tablet Commonly known as:  XANAX TAKE 1 TABLET BY MOUTH DAILY AS NEEDED FOR ANXIETY   Black Cohosh 160 MG Caps black cohosh   sertraline 50 MG tablet Commonly known as:  ZOLOFT TAKE 1 TABLET BY MOUTH EVERY DAY          Objective:    BP 126/84   Pulse 80   Temp (!) 97.1 F (36.2 C) (Oral)   Ht  5\' 5"  (1.651 m)   Wt 204 lb 6.4 oz (92.7 kg)   BMI 34.01 kg/m   No Known Allergies  Wt Readings from Last 3 Encounters:  09/18/17 204 lb 6.4 oz (92.7 kg)  08/10/17 202 lb (91.6 kg)  06/02/17 202 lb (91.6 kg)    Physical Exam  Constitutional: She is oriented to person, place, and time. She appears well-developed and well-nourished.  HENT:  Head: Normocephalic and atraumatic.  Eyes: Conjunctivae and EOM are normal. Pupils are equal, round, and reactive to light.  Cardiovascular: Normal rate, regular rhythm, normal heart sounds and intact distal pulses.  Pulmonary/Chest: Effort normal and breath sounds normal.  Abdominal: Soft. Bowel sounds are normal.  Musculoskeletal:       Lumbar back: She exhibits tenderness and pain. She exhibits no swelling, no deformity and no spasm.       Back:  Neurological: She is alert and oriented to person, place, and time. She has normal reflexes.  Skin: Skin is warm and dry. No rash noted.  Psychiatric: She has a normal mood and affect. Her behavior is  normal. Judgment and thought content normal.        Assessment & Plan:   1. Chronic midline low back pain without sciatica - DG Lumbar Spine 2-3 Views; Future Back exercises, consider PT  Continue all other maintenance medications as listed above.  Follow up plan: No Follow-up on file.  Educational handout given for back exercises  Terald Sleeper PA-C [facility] [address]  [place], [place] [address] [phone]   09/18/2017, 2:11 PM

## 2017-09-26 ENCOUNTER — Other Ambulatory Visit: Payer: Self-pay | Admitting: Nurse Practitioner

## 2017-10-03 ENCOUNTER — Other Ambulatory Visit: Payer: Self-pay | Admitting: Family Medicine

## 2017-10-03 ENCOUNTER — Telehealth: Payer: Self-pay | Admitting: Nurse Practitioner

## 2017-10-03 MED ORDER — AZITHROMYCIN 250 MG PO TABS: ORAL_TABLET | ORAL | 0 refills | Status: DC

## 2017-10-03 NOTE — Telephone Encounter (Signed)
Please address

## 2017-10-03 NOTE — Telephone Encounter (Signed)
Let her know I sent in a Z-Pak

## 2017-10-16 ENCOUNTER — Encounter: Payer: Self-pay | Admitting: Nurse Practitioner

## 2017-10-16 ENCOUNTER — Ambulatory Visit: Payer: BLUE CROSS/BLUE SHIELD | Admitting: Nurse Practitioner

## 2017-10-16 VITALS — BP 108/76 | HR 85 | Temp 98.0°F | Ht 65.0 in | Wt 202.0 lb

## 2017-10-16 DIAGNOSIS — R05 Cough: Secondary | ICD-10-CM | POA: Diagnosis not present

## 2017-10-16 DIAGNOSIS — R059 Cough, unspecified: Secondary | ICD-10-CM

## 2017-10-16 MED ORDER — METHYLPREDNISOLONE ACETATE 80 MG/ML IJ SUSP
80.0000 mg | Freq: Once | INTRAMUSCULAR | Status: AC
  Administered 2017-10-16: 80 mg via INTRAMUSCULAR

## 2017-10-16 NOTE — Progress Notes (Signed)
   Subjective:    Patient ID: Susan Whitney, female    DOB: January 13, 1961, 57 y.o.   MRN: [id number]  HPI Patient comes in c/o cough for over 3 weeks. Has a lot of phlegm. Has developed a sore throat from coughing so much.    Review of Systems  Constitutional: Negative for chills and fever.  HENT: Positive for congestion.   Respiratory: Positive for cough. Negative for shortness of breath.   Cardiovascular: Negative for chest pain, palpitations and leg swelling.  Gastrointestinal: Negative.   Genitourinary: Negative.   Neurological: Negative.   Psychiatric/Behavioral: Negative.   All other systems reviewed and are negative.      Objective:   Physical Exam  Constitutional: She is oriented to person, place, and time. She appears well-developed and well-nourished. No distress.  HENT:  Right Ear: Hearing, tympanic membrane, external ear and ear canal normal.  Left Ear: Hearing, tympanic membrane, external ear and ear canal normal.  Nose: Mucosal edema and rhinorrhea present. Right sinus exhibits no maxillary sinus tenderness and no frontal sinus tenderness. Left sinus exhibits no maxillary sinus tenderness and no frontal sinus tenderness.  Mouth/Throat: Uvula is midline, oropharynx is clear and moist and mucous membranes are normal.  Neck: Normal range of motion. Neck supple.  Cardiovascular: Normal rate and regular rhythm.  Pulmonary/Chest: Effort normal and breath sounds normal.  Deep dry cough   Lymphadenopathy:    She has no cervical adenopathy.  Neurological: She is alert and oriented to person, place, and time.  Skin: Skin is warm and dry.  Psychiatric: She has a normal mood and affect. Her behavior is normal. Judgment and thought content normal.   BP 108/76   Pulse 85   Temp 98 F (36.7 C) (Oral)   Ht 5\' 5"  (1.651 m)   Wt 202 lb (91.6 kg)   BMI 33.61 kg/m       Assessment & Plan:   1. Cough    Meds ordered this encounter  Medications  . methylPREDNISolone acetate  (DEPO-MEDROL) injection 80 mg   1. Take meds as prescribed 2. Use a cool mist humidifier especially during the winter months and when heat has been humid. 3. Use saline nose sprays frequently 4. Saline irrigations of the nose can be very helpful if done frequently.  * 4X daily for 1 week*  * Use of a nettie pot can be helpful with this. Follow directions with this* 5. Drink plenty of fluids 6. Keep thermostat turn down low 7.For any cough or congestion  Use plain Mucinex- regular strength or max strength is fine   * Children- consult with Pharmacist for dosing 8. For fever or aces or pains- take tylenol or ibuprofen appropriate for age and weight.  * for fevers greater than 101 orally you may alternate ibuprofen and tylenol every  3 hours.   Mary-Margaret Hassell Done, FNP

## 2017-10-16 NOTE — Patient Instructions (Signed)

## 2017-12-20 ENCOUNTER — Other Ambulatory Visit: Payer: Self-pay | Admitting: Nurse Practitioner

## 2018-01-25 ENCOUNTER — Encounter: Payer: Self-pay | Admitting: Physician Assistant

## 2018-01-29 ENCOUNTER — Encounter: Payer: Self-pay | Admitting: Physician Assistant

## 2018-01-29 ENCOUNTER — Ambulatory Visit: Payer: BLUE CROSS/BLUE SHIELD | Admitting: Physician Assistant

## 2018-01-29 VITALS — BP 113/69 | HR 68 | Temp 97.0°F | Ht 65.0 in | Wt 203.4 lb

## 2018-01-29 DIAGNOSIS — Z9049 Acquired absence of other specified parts of digestive tract: Secondary | ICD-10-CM | POA: Diagnosis not present

## 2018-01-29 DIAGNOSIS — Z8719 Personal history of other diseases of the digestive system: Secondary | ICD-10-CM | POA: Diagnosis not present

## 2018-01-29 NOTE — Progress Notes (Signed)
BP 113/69   Pulse 68   Temp (!) 97 F (36.1 C) (Oral)   Ht 5\' 5"  (1.651 m)   Wt 203 lb 6.4 oz (92.3 kg)   BMI 33.85 kg/m    Subjective:    Patient ID: Susan Whitney, female    DOB: 1960/11/27, 57 y.o.   MRN: [id number]  HPI: Susan Whitney is a 57 y.o. female presenting on 01/29/2018 for Hospitalization Follow-up (Gallbladder removal )  This patient comes in for hospital follow-up for her gallbladder removed.  The patient was on her vacation at [facility], [place].  She had the attack for couple of days before she had to go to the emergency room.  They were able to remove the gallbladder and it did have stones present.  She denies any complications since the surgery.  She is back at work at this time and doing well.  I have reviewed all of her records brought from the other hospital.  Past Medical History:  Diagnosis Date  . Anemia   . Anxiety   . Osteopenia   . RLS (restless legs syndrome)    Relevant past medical, surgical, family and social history reviewed and updated as indicated. Interim medical history since our last visit reviewed. Allergies and medications reviewed and updated. DATA REVIEWED: CHART IN EPIC  Family History reviewed for pertinent findings.  Review of Systems  Constitutional: Negative.  Negative for activity change, fatigue and fever.  HENT: Negative.   Eyes: Negative.   Respiratory: Negative.  Negative for cough.   Cardiovascular: Negative.  Negative for chest pain.  Gastrointestinal: Negative.  Negative for abdominal pain.  Endocrine: Negative.   Genitourinary: Negative.  Negative for dysuria.  Musculoskeletal: Negative.   Skin: Negative.   Neurological: Negative.     Allergies as of 01/29/2018   No Known Allergies     Medication List        Accurate as of 01/29/18 11:59 PM. Always use your most recent med list.          ALPRAZolam 0.25 MG tablet Commonly known as:  XANAX TAKE 1 TABLET BY MOUTH DAILY AS NEEDED FOR ANXIETY     Black Cohosh 160 MG Caps black cohosh   sertraline 50 MG tablet Commonly known as:  ZOLOFT TAKE 1 TABLET BY MOUTH EVERY DAY          Objective:    BP 113/69   Pulse 68   Temp (!) 97 F (36.1 C) (Oral)   Ht 5\' 5"  (1.651 m)   Wt 203 lb 6.4 oz (92.3 kg)   BMI 33.85 kg/m   No Known Allergies  Wt Readings from Last 3 Encounters:  01/29/18 203 lb 6.4 oz (92.3 kg)  10/16/17 202 lb (91.6 kg)  09/18/17 204 lb 6.4 oz (92.7 kg)    Physical Exam  Constitutional: She is oriented to person, place, and time. She appears well-developed and well-nourished.  HENT:  Head: Normocephalic and atraumatic.  Right Ear: Tympanic membrane, external ear and ear canal normal.  Left Ear: Tympanic membrane, external ear and ear canal normal.  Nose: Nose normal. No rhinorrhea.  Mouth/Throat: Oropharynx is clear and moist and mucous membranes are normal. No oropharyngeal exudate or posterior oropharyngeal erythema.  Eyes: Pupils are equal, round, and reactive to light. Conjunctivae and EOM are normal.  Neck: Normal range of motion. Neck supple.  Cardiovascular: Normal rate, regular rhythm, normal heart sounds and intact distal pulses.  Pulmonary/Chest: Effort normal  and breath sounds normal.  Abdominal: Soft. Bowel sounds are normal.    All of the opening from the laparoscopic surgery are healing very well.  There is no drainage.  There is scabbing.  She did have a glue procedure to close the wounds.  Neurological: She is alert and oriented to person, place, and time. She has normal reflexes.  Skin: Skin is warm and dry. No rash noted.  Psychiatric: She has a normal mood and affect. Her behavior is normal. Judgment and thought content normal.    Results for orders placed or performed in visit on 08/28/15  VITAMIN D 25 Hydroxy (Vit-D Deficiency, Fractures)  Result Value Ref Range   Vit D, 25-Hydroxy 25.8 (L) 30.0 - 100.0 ng/mL      Assessment & Plan:   1. History of gallstones Surgically  removed  2. S/P cholecystectomy Well-healed   Continue all other maintenance medications as listed above.  Follow up plan: No follow-ups on file.  Educational handout given for [place] PA-C [facility] [address]  [place], [place] [address] [phone]   02/01/2018, 8:25 AM

## 2018-02-01 DIAGNOSIS — Z9049 Acquired absence of other specified parts of digestive tract: Secondary | ICD-10-CM

## 2018-02-01 DIAGNOSIS — Z8719 Personal history of other diseases of the digestive system: Secondary | ICD-10-CM

## 2018-02-01 HISTORY — DX: Acquired absence of other specified parts of digestive tract: Z90.49

## 2018-02-01 HISTORY — DX: Personal history of other diseases of the digestive system: Z87.19

## 2018-02-01 NOTE — Patient Instructions (Signed)
In a few days you may receive a survey in the mail or online from [employer] regarding your visit with us today. Please take a moment to fill this out. Your feedback is very important to our whole office. It can help us better understand your needs as well as improve your experience and satisfaction. Thank you for taking your time to complete it. We care about you.  Atalia Litzinger, PA-C  

## 2018-03-03 ENCOUNTER — Encounter: Payer: Self-pay | Admitting: Family Medicine

## 2018-03-03 ENCOUNTER — Ambulatory Visit (INDEPENDENT_AMBULATORY_CARE_PROVIDER_SITE_OTHER): Payer: BLUE CROSS/BLUE SHIELD | Admitting: Family Medicine

## 2018-03-03 VITALS — BP 116/75 | HR 77 | Temp 97.4°F | Ht 65.0 in | Wt 203.0 lb

## 2018-03-03 DIAGNOSIS — J358 Other chronic diseases of tonsils and adenoids: Secondary | ICD-10-CM

## 2018-03-03 NOTE — Progress Notes (Signed)
BP 116/75   Pulse 77   Temp (!) 97.4 F (36.3 C) (Oral)   Ht 5\' 5"  (1.651 m)   Wt 203 lb (92.1 kg)   BMI 33.78 kg/m    Subjective:    Patient ID: Susan Whitney, female    DOB: 1960-10-11, 57 y.o.   MRN: [id number]  HPI: Susan Whitney is a 57 y.o. female presenting on 03/03/2018 for Sore Throat (x 1 day)   HPI Sore throat Patient has been having a sore throat for 1 day and feels like she has swelling in the back of her throat over the past day.  She is most concerned because over the weekend she is going to be seen for church in a group.  She denies any fevers or chills or shortness of breath or sinus congestion or ear pressure.  Mainly just the back of her throat hurts.  She denies any cough  Relevant past medical, surgical, family and social history reviewed and updated as indicated. Interim medical history since our last visit reviewed. Allergies and medications reviewed and updated.  Review of Systems  Constitutional: Negative for chills and fever.  HENT: Positive for sore throat. Negative for congestion, ear discharge, ear pain, postnasal drip, rhinorrhea, sinus pressure and sneezing.   Eyes: Negative for pain, redness and visual disturbance.  Respiratory: Negative for cough, chest tightness and shortness of breath.   Cardiovascular: Negative for chest pain and leg swelling.  Genitourinary: Negative for difficulty urinating and dysuria.  Musculoskeletal: Negative for back pain and gait problem.  Skin: Negative for rash.  Neurological: Negative for light-headedness and headaches.  Psychiatric/Behavioral: Negative for agitation and behavioral problems.  All other systems reviewed and are negative.   Per HPI unless specifically indicated above   Allergies as of 03/03/2018   No Known Allergies     Medication List        Accurate as of 03/03/18  8:20 AM. Always use your most recent med list.          ALPRAZolam 0.25 MG tablet Commonly known as:  XANAX TAKE 1 TABLET  BY MOUTH DAILY AS NEEDED FOR ANXIETY   Black Cohosh 160 MG Caps black cohosh   sertraline 50 MG tablet Commonly known as:  ZOLOFT TAKE 1 TABLET BY MOUTH EVERY DAY          Objective:    BP 116/75   Pulse 77   Temp (!) 97.4 F (36.3 C) (Oral)   Ht 5\' 5"  (1.651 m)   Wt 203 lb (92.1 kg)   BMI 33.78 kg/m   Wt Readings from Last 3 Encounters:  03/03/18 203 lb (92.1 kg)  01/29/18 203 lb 6.4 oz (92.3 kg)  10/16/17 202 lb (91.6 kg)    Physical Exam  Constitutional: She is oriented to person, place, and time. She appears well-developed and well-nourished. No distress.  HENT:  Right Ear: Tympanic membrane and ear canal normal.  Left Ear: Tympanic membrane and ear canal normal.  Mouth/Throat: No dental abscesses or uvula swelling. Posterior oropharyngeal edema (Cryptic tonsils, mild inflammation, likely had tonsillolith) present. No oropharyngeal exudate, posterior oropharyngeal erythema or tonsillar abscesses.  Eyes: Conjunctivae are normal.  Cardiovascular: Normal rate, regular rhythm, normal heart sounds and intact distal pulses.  No murmur heard. Pulmonary/Chest: Effort normal and breath sounds normal. No respiratory distress. She has no wheezes.  Musculoskeletal: Normal range of motion. She exhibits no edema or tenderness.  Neurological: She is alert and oriented to person, place,  and time. Coordination normal.  Skin: Skin is warm and dry. No rash noted. She is not diaphoretic.  Psychiatric: She has a normal mood and affect. Her behavior is normal.  Nursing note and vitals reviewed.       Assessment & Plan:   Problem List Items Addressed This Visit    None    Visit Diagnoses    Tonsil stone    -  Primary      Recommended antihistamine and NSAID and salt water gargles Follow up plan: Return if symptoms worsen or fail to improve.  Counseling provided for all of the vaccine components No orders of the defined types were placed in this encounter.   Caryl Pina, MD [facility] 03/03/2018, 8:20 AM

## 2018-03-23 ENCOUNTER — Other Ambulatory Visit: Payer: Self-pay | Admitting: Nurse Practitioner

## 2018-04-06 ENCOUNTER — Ambulatory Visit: Payer: BLUE CROSS/BLUE SHIELD

## 2018-04-06 ENCOUNTER — Encounter: Payer: Self-pay | Admitting: Family Medicine

## 2018-04-06 ENCOUNTER — Ambulatory Visit: Payer: BLUE CROSS/BLUE SHIELD | Admitting: Family Medicine

## 2018-04-06 VITALS — BP 129/86 | HR 77 | Temp 97.6°F | Ht 65.0 in | Wt 211.0 lb

## 2018-04-06 DIAGNOSIS — J0100 Acute maxillary sinusitis, unspecified: Secondary | ICD-10-CM

## 2018-04-06 MED ORDER — METHYLPREDNISOLONE ACETATE 80 MG/ML IJ SUSP
80.0000 mg | Freq: Once | INTRAMUSCULAR | Status: AC
  Administered 2018-04-06: 80 mg via INTRAMUSCULAR

## 2018-04-06 NOTE — Patient Instructions (Signed)
It does not look bacterial at this point.  I gave you a dose of Depo-Medrol 80 mg intramuscularly to help with inflammation.  We discussed using Afrin nasal spray or Sudafed for sinus inflammation/stuffiness.  It appears that you have a viral upper respiratory infection (cold).  Cold symptoms can last up to 2 weeks.    - Get plenty of rest and drink plenty of fluids. - Try to breathe moist air. Use a cold mist humidifier. - Consume warm fluids (soup or tea) to provide relief for a stuffy nose and to loosen phlegm. - For nasal stuffiness, try saline nasal spray or a Neti Pot. Afrin nasal spray can also be used but this product should not be used longer than 3 days or it will cause rebound nasal stuffiness (worsening nasal congestion). - For sore throat pain relief: use chloraseptic spray, suck on throat lozenges, hard candy or popsicles; gargle with warm salt water (1/4 tsp. salt per 8 oz. of water); and eat soft, bland foods. - Eat a well-balanced diet. If you cannot, ensure you are getting enough nutrients by taking a daily multivitamin. - Avoid dairy products, as they can thicken phlegm. - Avoid alcohol, as it impairs your body's immune system.  CONTACT YOUR DOCTOR IF YOU EXPERIENCE ANY OF THE FOLLOWING: - High fever - Ear pain - Sinus-type headache - Unusually severe cold symptoms - Cough that gets worse while other cold symptoms improve - Flare up of any chronic lung problem, such as asthma - Your symptoms persist longer than 2 weeks

## 2018-04-06 NOTE — Progress Notes (Signed)
Subjective: CC: Cough and congestion PCP: Terald Sleeper, PA-C HPI:Susan Whitney is a 57 y.o. female presenting to clinic today for:  1.  Cough and congestion Patient reports a 1 day history of mildly productive cough with sinus congestion.  She denies any shortness of breath, wheeze or hemoptysis.  She feels like it has slightly worsened since yesterday despite using an over-the-counter sinus and allergy medicine this morning.  Reports yellow mucus from the nares but no overt purulence.  She notes sinus headache with some dental pain.  No fevers, nausea, vomiting, diarrhea.   ROS: Per HPI  No Known Allergies Past Medical History:  Diagnosis Date  . Anemia   . Anxiety   . Osteopenia   . RLS (restless legs syndrome)     Current Outpatient Medications:  .  ALPRAZolam (XANAX) 0.25 MG tablet, TAKE 1 TABLET BY MOUTH DAILY AS NEEDED FOR ANXIETY, Disp: 30 tablet, Rfl: 0 .  Black Cohosh 160 MG CAPS, black cohosh, Disp: , Rfl:  .  sertraline (ZOLOFT) 50 MG tablet, TAKE 1 TABLET BY MOUTH EVERY DAY, Disp: 90 tablet, Rfl: 0 Social History   Socioeconomic History  . Marital status: Married    Spouse name: Not on file  . Number of children: Not on file  . Years of education: Not on file  . Highest education level: Not on file  Occupational History  . Not on file  Social Needs  . Financial resource strain: Not on file  . Food insecurity:    Worry: Not on file    Inability: Not on file  . Transportation needs:    Medical: Not on file    Non-medical: Not on file  Tobacco Use  . Smoking status: Never Smoker  . Smokeless tobacco: Never Used  Substance and Sexual Activity  . Alcohol use: No  . Drug use: No  . Sexual activity: Not on file  Lifestyle  . Physical activity:    Days per week: Not on file    Minutes per session: Not on file  . Stress: Not on file  Relationships  . Social connections:    Talks on phone: Not on file    Gets together: Not on file    Attends religious  service: Not on file    Active member of club or organization: Not on file    Attends meetings of clubs or organizations: Not on file    Relationship status: Not on file  . Intimate partner violence:    Fear of current or ex partner: Not on file    Emotionally abused: Not on file    Physically abused: Not on file    Forced sexual activity: Not on file  Other Topics Concern  . Not on file  Social History Narrative  . Not on file   Family History  Problem Relation Age of Onset  . Emphysema Mother        smoked  . Hip fracture Mother   . Colon cancer Father   . Hypertension Sister   . Hypertension Brother     Objective: Office vital signs reviewed. BP 129/86   Pulse 77   Temp 97.6 F (36.4 C) (Oral)   Ht 5\' 5"  (1.651 m)   Wt 211 lb (95.7 kg)   SpO2 98%   BMI 35.11 kg/m   Physical Examination:  General: Awake, alert, well nourished, No acute distress HEENT: No sinus tenderness to palpation    Neck: No masses palpated. No  lymphadenopathy    Ears: Tympanic membranes intact, normal light reflex, no erythema, no bulging    Eyes: PERRLA, extraocular membranes intact, sclera white    Nose: nasal turbinates moist, clear nasal discharge    Throat: moist mucus membranes, mild oropharyngeal erythema, no tonsillar exudate.  Airway is patent Cardio: regular rate and rhythm, S1S2 heard, no murmurs appreciated Pulm: clear to auscultation bilaterally, no wheezes, rhonchi or rales; normal work of breathing on room air  Assessment/ Plan: 57 y.o. female   1. Acute non-recurrent maxillary sinusitis No evidence of bacterial sinus infection on exam.  She has normal work of breathing with normal vital signs including pulse ox on room air.  Physical exam fairly unremarkable except for mild oropharyngeal erythema.  She was given a dose of Depo-Medrol 80 mg intramuscularly here today in efforts to help relieve some of her symptoms.  I recommended continuing conservative care with OTC remedies,  including Afrin and pseudoephedrine if needed for sinus congestion.  Home care instructions were reviewed with the patient and a handout was provided.  She will follow-up PRN.  If symptoms worsen or persist, plan for oral antibiotic.  No orders of the defined types were placed in this encounter.  Meds ordered this encounter  Medications  . methylPREDNISolone acetate (DEPO-MEDROL) injection 80 mg     Janora Norlander, DO [facility] [phone]

## 2018-04-15 ENCOUNTER — Ambulatory Visit: Payer: BLUE CROSS/BLUE SHIELD | Admitting: Physician Assistant

## 2018-05-13 ENCOUNTER — Encounter: Payer: Self-pay | Admitting: Family Medicine

## 2018-05-13 ENCOUNTER — Ambulatory Visit: Payer: BLUE CROSS/BLUE SHIELD | Admitting: Family Medicine

## 2018-05-13 VITALS — BP 127/78 | HR 78 | Temp 97.2°F | Ht 65.0 in | Wt 198.4 lb

## 2018-05-13 DIAGNOSIS — R220 Localized swelling, mass and lump, head: Secondary | ICD-10-CM | POA: Diagnosis not present

## 2018-05-13 MED ORDER — METHYLPREDNISOLONE ACETATE 80 MG/ML IJ SUSP
80.0000 mg | Freq: Once | INTRAMUSCULAR | Status: AC
  Administered 2018-05-13: 80 mg via INTRAMUSCULAR

## 2018-05-13 NOTE — Addendum Note (Signed)
Addended by: Nigel Berthold C on: 05/13/2018 11:43 AM   Modules accepted: Orders

## 2018-05-13 NOTE — Progress Notes (Signed)
BP 127/78   Pulse 78   Temp (!) 97.2 F (36.2 C) (Oral)   Ht 5\' 5"  (1.651 m)   Wt 198 lb 6.4 oz (90 kg)   BMI 33.02 kg/m    Subjective:    Patient ID: [name], female    DOB: 05-20-1961, 57 y.o.   MRN: [id number]  HPI: ADABELLA STANIS is a 57 y.o. female presenting on 05/13/2018 for Facial Swelling (Patient states she has started having right sided facial swelling. States she feels fine and this has happened before on the other side of her face and it went away in a few hours.)   HPI Right side lower facial swelling Patient comes in with complaints of right-sided lower facial swelling that just started this morning a couple hours ago.  She denies any pain or itching with it.  She just saw her dentist a couple weeks ago and was given a clean bill of health and has not had any dental pain.  She denies any new foods or new make-up products or new beauty products.  She denies any new medicines and is only on sertraline and alprazolam and black cohosh.  She also takes magnesium and a multivitamin.  He has not taken anything for it as of yet because it just started coming on.  The same thing happened 3 weeks ago on the left side of her face and she took an allergy medicine and then it went away rather quickly.  Relevant past medical, surgical, family and social history reviewed and updated as indicated. Interim medical history since our last visit reviewed. Allergies and medications reviewed and updated.  Review of Systems  Constitutional: Negative for chills and fever.  HENT: Positive for facial swelling. Negative for congestion, ear discharge, ear pain and trouble swallowing.   Eyes: Negative for visual disturbance.  Respiratory: Negative for chest tightness, shortness of breath, wheezing and stridor.   Cardiovascular: Negative for chest pain and leg swelling.  Musculoskeletal: Negative for back pain and gait problem.  Skin: Negative for rash.  Neurological: Negative for  light-headedness and headaches.  Psychiatric/Behavioral: Negative for agitation and behavioral problems.  All other systems reviewed and are negative.   Per HPI unless specifically indicated above   Allergies as of 05/13/2018   No Known Allergies     Medication List        Accurate as of 05/13/18  8:25 AM. Always use your most recent med list.          ALPRAZolam 0.25 MG tablet Commonly known as:  XANAX TAKE 1 TABLET BY MOUTH DAILY AS NEEDED FOR ANXIETY   Black Cohosh 160 MG Caps black cohosh   sertraline 50 MG tablet Commonly known as:  ZOLOFT TAKE 1 TABLET BY MOUTH EVERY DAY          Objective:    BP 127/78   Pulse 78   Temp (!) 97.2 F (36.2 C) (Oral)   Ht 5\' 5"  (1.651 m)   Wt 198 lb 6.4 oz (90 kg)   BMI 33.02 kg/m   Wt Readings from Last 3 Encounters:  05/13/18 198 lb 6.4 oz (90 kg)  04/06/18 211 lb (95.7 kg)  03/03/18 203 lb (92.1 kg)    Physical Exam  Constitutional: She is oriented to person, place, and time. She appears well-developed and well-nourished. No distress.  HENT:  Head:    Mouth/Throat: Uvula is midline, oropharynx is clear and moist and mucous membranes are normal. No posterior  oropharyngeal erythema. Tonsils are 1+ on the right. Tonsils are 1+ on the left.  Eyes: Conjunctivae are normal.  Neck: Trachea normal and normal range of motion. Neck supple.  Musculoskeletal: Normal range of motion. She exhibits no edema or tenderness.  Lymphadenopathy:    She has no cervical adenopathy.  Neurological: She is alert and oriented to person, place, and time. Coordination normal.  Skin: Skin is warm and dry. No rash noted. She is not diaphoretic.  Psychiatric: She has a normal mood and affect. Her behavior is normal.  Nursing note and vitals reviewed.       Assessment & Plan:   Problem List Items Addressed This Visit    None    Visit Diagnoses    Facial swelling    -  Primary   It looks like an allergic reaction on right side facial  swelling, gave allergy pill, if worsens will give steroids, consider allergy referral in future if retur       Follow up plan: Return if symptoms worsen or fail to improve.  Counseling provided for all of the vaccine components No orders of the defined types were placed in this encounter.   Caryl Pina, MD [facility] 05/13/2018, 8:25 AM

## 2018-06-16 ENCOUNTER — Other Ambulatory Visit: Payer: Self-pay | Admitting: Nurse Practitioner

## 2018-06-23 ENCOUNTER — Encounter: Payer: Self-pay | Admitting: Physician Assistant

## 2018-06-23 ENCOUNTER — Ambulatory Visit (INDEPENDENT_AMBULATORY_CARE_PROVIDER_SITE_OTHER): Payer: BLUE CROSS/BLUE SHIELD | Admitting: Physician Assistant

## 2018-06-23 VITALS — BP 108/71 | HR 82 | Temp 97.2°F | Ht 65.0 in | Wt 185.4 lb

## 2018-06-23 DIAGNOSIS — F3342 Major depressive disorder, recurrent, in full remission: Secondary | ICD-10-CM | POA: Diagnosis not present

## 2018-06-23 DIAGNOSIS — Z0000 Encounter for general adult medical examination without abnormal findings: Secondary | ICD-10-CM

## 2018-06-23 MED ORDER — SERTRALINE HCL 50 MG PO TABS: 50.0000 mg | ORAL_TABLET | Freq: Every day | ORAL | 3 refills | Status: DC

## 2018-06-23 NOTE — Progress Notes (Signed)
BP 108/71   Pulse 82   Temp (!) 97.2 F (36.2 C) (Oral)   Ht '5\' 5"'  (1.651 m)   Wt 185 lb 6.4 oz (84.1 kg)   BMI 30.85 kg/m    Subjective:    Patient ID: Susan Whitney, female    DOB: 02-15-1961, 57 y.o.   MRN: [id number]  HPI: Susan Whitney is a 57 y.o. female presenting on 06/23/2018 for Medication Refill  This patient comes in for periodic recheck on medications and conditions including depression and annual exam. Labs and DEXA are ordered. She is current on her PAP and mammogram. The zoloft has continued to work very well.   All medications are reviewed today. There are no reports of any problems with the medications. All of the medical conditions are reviewed and updated.  Lab work is reviewed and will be ordered as medically necessary. There are no new problems reported with today's visit.   Past Medical History:  Diagnosis Date  . Anemia   . Anxiety   . Osteopenia   . RLS (restless legs syndrome)    Relevant past medical, surgical, family and social history reviewed and updated as indicated. Interim medical history since our last visit reviewed. Allergies and medications reviewed and updated. DATA REVIEWED: CHART IN EPIC  Family History reviewed for pertinent findings.  Review of Systems  Constitutional: Negative.  Negative for activity change, fatigue and fever.  HENT: Negative.   Eyes: Negative.   Respiratory: Negative.  Negative for cough.   Cardiovascular: Negative.  Negative for chest pain.  Gastrointestinal: Negative.  Negative for abdominal pain.  Endocrine: Negative.   Genitourinary: Negative.  Negative for dysuria.  Musculoskeletal: Negative.   Skin: Negative.   Neurological: Negative.     Allergies as of 06/23/2018   No Known Allergies     Medication List        Accurate as of 06/23/18  9:01 AM. Always use your most recent med list.          ALPRAZolam 0.25 MG tablet Commonly known as:  XANAX TAKE 1 TABLET BY MOUTH DAILY AS NEEDED FOR  ANXIETY   Black Cohosh 160 MG Caps black cohosh   sertraline 50 MG tablet Commonly known as:  ZOLOFT Take 1 tablet (50 mg total) by mouth daily.          Objective:    BP 108/71   Pulse 82   Temp (!) 97.2 F (36.2 C) (Oral)   Ht '5\' 5"'  (1.651 m)   Wt 185 lb 6.4 oz (84.1 kg)   BMI 30.85 kg/m   No Known Allergies  Wt Readings from Last 3 Encounters:  06/23/18 185 lb 6.4 oz (84.1 kg)  05/13/18 198 lb 6.4 oz (90 kg)  04/06/18 211 lb (95.7 kg)    Physical Exam  Constitutional: She is oriented to person, place, and time. She appears well-developed and well-nourished.  HENT:  Head: Normocephalic and atraumatic.  Right Ear: Tympanic membrane, external ear and ear canal normal.  Left Ear: Tympanic membrane, external ear and ear canal normal.  Nose: Nose normal. No rhinorrhea.  Mouth/Throat: Oropharynx is clear and moist and mucous membranes are normal. No oropharyngeal exudate or posterior oropharyngeal erythema.  Eyes: Pupils are equal, round, and reactive to light. Conjunctivae and EOM are normal.  Neck: Normal range of motion. Neck supple.  Cardiovascular: Normal rate, regular rhythm, normal heart sounds and intact distal pulses.  Pulmonary/Chest: Effort normal and breath sounds normal.  Abdominal:  Soft. Bowel sounds are normal.  Neurological: She is alert and oriented to person, place, and time. She has normal reflexes.  Skin: Skin is warm and dry. No rash noted.  Psychiatric: She has a normal mood and affect. Her behavior is normal. Judgment and thought content normal.    Results for orders placed or performed in visit on 08/28/15  VITAMIN D 25 Hydroxy (Vit-D Deficiency, Fractures)  Result Value Ref Range   Vit D, 25-Hydroxy 25.8 (L) 30.0 - 100.0 ng/mL      Assessment & Plan:   1. Recurrent major depressive disorder, in full remission ([place]) - sertraline (ZOLOFT) 50 MG tablet; Take 1 tablet (50 mg total) by mouth daily.  Dispense: 90 tablet; Refill: 3  2. Well  adult exam - CMP14+EGFR; Future - CBC with Differential/Platelet; Future - Lipid panel; Future - TSH; Future - DG [facility] DEXA; Future   Continue all other maintenance medications as listed above.  Follow up plan: No follow-ups on file.  Educational handout given for [place] PA-C [facility] [address]  [place], [place] [address] [phone]   06/23/2018, 9:01 AM

## 2018-08-20 ENCOUNTER — Telehealth: Payer: Self-pay

## 2018-08-20 ENCOUNTER — Other Ambulatory Visit: Payer: Self-pay | Admitting: Family Medicine

## 2018-08-20 MED ORDER — OSELTAMIVIR PHOSPHATE 75 MG PO CAPS: 75.0000 mg | ORAL_CAPSULE | Freq: Every day | ORAL | 0 refills | Status: DC

## 2018-08-20 NOTE — Telephone Encounter (Signed)
I sent in the requested prescription 

## 2018-08-20 NOTE — Telephone Encounter (Signed)
PLease send in Tamiflu for prevention  [facility]

## 2018-08-20 NOTE — Telephone Encounter (Signed)
Pt aware.

## 2018-08-23 ENCOUNTER — Other Ambulatory Visit: Payer: Self-pay | Admitting: Physician Assistant

## 2018-09-10 ENCOUNTER — Encounter: Payer: Self-pay | Admitting: Physician Assistant

## 2018-09-10 ENCOUNTER — Encounter: Payer: Self-pay | Admitting: *Deleted

## 2018-10-13 ENCOUNTER — Telehealth: Payer: BLUE CROSS/BLUE SHIELD | Admitting: Family Medicine

## 2018-10-19 ENCOUNTER — Other Ambulatory Visit: Payer: Self-pay | Admitting: Physician Assistant

## 2018-10-20 ENCOUNTER — Other Ambulatory Visit: Payer: Self-pay | Admitting: Physician Assistant

## 2018-10-21 ENCOUNTER — Other Ambulatory Visit: Payer: Self-pay | Admitting: *Deleted

## 2018-11-10 NOTE — Progress Notes (Signed)
completed

## 2018-12-03 ENCOUNTER — Other Ambulatory Visit: Payer: Self-pay

## 2018-12-03 ENCOUNTER — Telehealth: Payer: BLUE CROSS/BLUE SHIELD | Admitting: Family Medicine

## 2019-01-10 ENCOUNTER — Ambulatory Visit: Payer: BLUE CROSS/BLUE SHIELD | Admitting: Family

## 2019-01-10 ENCOUNTER — Other Ambulatory Visit: Payer: Self-pay

## 2019-01-10 ENCOUNTER — Encounter: Payer: Self-pay | Admitting: Family

## 2019-01-10 VITALS — BP 107/70 | HR 64 | Temp 97.2°F | Ht 65.0 in | Wt 164.0 lb

## 2019-01-10 DIAGNOSIS — J301 Allergic rhinitis due to pollen: Secondary | ICD-10-CM

## 2019-01-10 DIAGNOSIS — H9201 Otalgia, right ear: Secondary | ICD-10-CM | POA: Diagnosis not present

## 2019-01-10 MED ORDER — FLUTICASONE PROPIONATE 50 MCG/ACT NA SUSP: 2.0000 | Freq: Every day | NASAL | 6 refills | Status: DC

## 2019-01-10 MED ORDER — CETIRIZINE HCL 10 MG PO TABS: 10.0000 mg | ORAL_TABLET | Freq: Every day | ORAL | 11 refills | Status: DC

## 2019-01-10 NOTE — Patient Instructions (Signed)

## 2019-01-10 NOTE — Progress Notes (Signed)
Subjective:    Patient ID: Susan Whitney, female    DOB: [birth year], 58 y.o.   MRN: [id number]  Chief Complaint  Patient presents with  . ear discomfort    Otalgia  There is pain in the right ear. This is a new problem. The current episode started in the past 7 days. The problem has been gradually worsening. There has been no fever. The pain is at a severity of 1/10. The pain is mild. Pertinent negatives include no coughing, diarrhea, ear discharge, hearing loss, rhinorrhea, sore throat or vomiting. She has tried nothing for the symptoms. The treatment provided no relief.      Review of Systems  HENT: Positive for ear pain. Negative for ear discharge, hearing loss, rhinorrhea and sore throat.   Respiratory: Negative for cough.   Gastrointestinal: Negative for diarrhea and vomiting.  All other systems reviewed and are negative.      Objective:   Physical Exam Vitals signs reviewed.  Constitutional:      General: She is not in acute distress.    Appearance: She is well-developed.  HENT:     Head: Normocephalic and atraumatic.     Right Ear: External ear normal. Tympanic membrane is bulging. Tympanic membrane is not erythematous.     Left Ear: Tympanic membrane is not erythematous.     Nose: Mucosal edema present.     Mouth/Throat:     Pharynx: Posterior oropharyngeal erythema present.  Eyes:     Pupils: Pupils are equal, round, and reactive to light.  Neck:     Musculoskeletal: Normal range of motion and neck supple.     Thyroid: No thyromegaly.  Cardiovascular:     Rate and Rhythm: Normal rate and regular rhythm.     Heart sounds: Normal heart sounds. No murmur.  Pulmonary:     Effort: Pulmonary effort is normal. No respiratory distress.     Breath sounds: Normal breath sounds. No wheezing.  Abdominal:     General: Bowel sounds are normal. There is no distension.     Palpations: Abdomen is soft.     Tenderness: There is no abdominal tenderness.  Musculoskeletal:  Normal range of motion.        General: No tenderness.  Skin:    General: Skin is warm and dry.  Neurological:     Mental Status: She is alert and oriented to person, place, and time.     Cranial Nerves: No cranial nerve deficit.     Deep Tendon Reflexes: Reflexes are normal and symmetric.  Psychiatric:        Behavior: Behavior normal.        Thought Content: Thought content normal.        Judgment: Judgment normal.       BP 107/70   Pulse 64   Temp (!) 97.2 F (36.2 C) (Oral)   Ht 5\' 5"  (1.651 m)   Wt 164 lb (74.4 kg)   BMI 27.29 kg/m      Assessment & Plan:  Susan Whitney comes in today with chief complaint of ear discomfort   Diagnosis and orders addressed:  1. Allergic rhinitis due to pollen, unspecified seasonality - cetirizine (ZYRTEC) 10 MG tablet; Take 1 tablet (10 mg total) by mouth daily.  Dispense: 30 tablet; Refill: 11 - fluticasone (FLONASE) 50 MCG/ACT nasal spray; Place 2 sprays into both nostrils daily.  Dispense: 16 g; Refill: 6  2. Right ear pain  Start zyrtec and flonase daily Tylenol  as needed for pain Rest Force fluids RTO if symptoms worsen or do not improve   Evelina Dun, FNP

## 2019-02-23 ENCOUNTER — Encounter: Payer: Self-pay | Admitting: Physician Assistant

## 2019-02-23 ENCOUNTER — Other Ambulatory Visit: Payer: Self-pay

## 2019-02-23 ENCOUNTER — Ambulatory Visit (INDEPENDENT_AMBULATORY_CARE_PROVIDER_SITE_OTHER): Payer: BC Managed Care – PPO | Admitting: Physician Assistant

## 2019-02-23 VITALS — BP 124/76 | HR 68 | Temp 97.8°F | Ht 65.0 in | Wt 162.0 lb

## 2019-02-23 DIAGNOSIS — M545 Low back pain, unspecified: Secondary | ICD-10-CM

## 2019-02-23 DIAGNOSIS — G8929 Other chronic pain: Secondary | ICD-10-CM | POA: Diagnosis not present

## 2019-02-23 MED ORDER — MELOXICAM 7.5 MG PO TABS: 7.5000 mg | ORAL_TABLET | Freq: Every day | ORAL | 0 refills | Status: DC

## 2019-02-25 ENCOUNTER — Encounter: Payer: Self-pay | Admitting: Physician Assistant

## 2019-02-25 NOTE — Progress Notes (Signed)
     BP 124/76   Pulse 68   Temp 97.8 F (36.6 C) (Temporal)   Ht 5\' 5"  (1.651 m)   Wt 162 lb (73.5 kg)   BMI 26.96 kg/m    Subjective:    Patient ID: Susan Whitney, female    DOB: [birth year], 58 y.o.   MRN: [id number]  HPI: Susan Whitney is a 58 y.o. female presenting on 02/23/2019 for Hip Pain and Leg Pain  This patient describes longstanding hip and leg pain.  She has had it over several years but it has become very persistent.  She is also having pain whenever she lays at night.  She changed her mattress but this does not help.  The pain is primarily in the left sciatic area.  She does not describe any weakness, and no trauma or injury.  She has used Tylenol and Advil in the past with some relief.  Past Medical History:  Diagnosis Date  . Anemia   . Anxiety   . Osteopenia   . RLS (restless legs syndrome)    Relevant past medical, surgical, family and social history reviewed and updated as indicated. Interim medical history since our last visit reviewed. Allergies and medications reviewed and updated. DATA REVIEWED: CHART IN EPIC  Family History reviewed for pertinent findings.  Review of Systems  Allergies as of 02/23/2019   No Known Allergies     Medication List       Accurate as of February 23, 2019 11:59 PM. If you have any questions, ask your nurse or doctor.        ALPRAZolam 0.25 MG tablet Commonly known as: XANAX TAKE 1 TABLET BY MOUTH DAILY AS NEEDED FOR ANXIETY   Black Cohosh 160 MG Caps black cohosh   cetirizine 10 MG tablet Commonly known as: ZYRTEC Take 1 tablet (10 mg total) by mouth daily.   fluticasone 50 MCG/ACT nasal spray Commonly known as: FLONASE Place 2 sprays into both nostrils daily.   meloxicam 7.5 MG tablet Commonly known as: MOBIC Take 1 tablet (7.5 mg total) by mouth daily. Started by: Terald Sleeper, PA-C   sertraline 50 MG tablet Commonly known as: ZOLOFT Take 1 tablet (50 mg total) by mouth daily.          Objective:     BP 124/76   Pulse 68   Temp 97.8 F (36.6 C) (Temporal)   Ht 5\' 5"  (1.651 m)   Wt 162 lb (73.5 kg)   BMI 26.96 kg/m   No Known Allergies  Wt Readings from Last 3 Encounters:  02/23/19 162 lb (73.5 kg)  01/10/19 164 lb (74.4 kg)  09/10/18 171 lb (77.6 kg)    Physical Exam  Results for orders placed or performed in visit on 08/28/15  VITAMIN D 25 Hydroxy (Vit-D Deficiency, Fractures)  Result Value Ref Range   Vit D, 25-Hydroxy 25.8 (L) 30.0 - 100.0 ng/mL      Assessment & Plan:   1. Chronic left-sided low back pain without sciatica - meloxicam (MOBIC) 7.5 MG tablet; Take 1 tablet (7.5 mg total) by mouth daily.  Dispense: 30 tablet; Refill: 0   Continue all other maintenance medications as listed above.  Follow up plan: No follow-ups on file.  Educational handout given for back exercises  Terald Sleeper PA-C [place] [address]  [place], [place] [id number] [phone]   02/25/2019, 1:36 PM

## 2019-03-18 ENCOUNTER — Other Ambulatory Visit: Payer: Self-pay | Admitting: Physician Assistant

## 2019-03-18 DIAGNOSIS — G8929 Other chronic pain: Secondary | ICD-10-CM

## 2019-03-18 DIAGNOSIS — M545 Low back pain, unspecified: Secondary | ICD-10-CM

## 2019-04-14 ENCOUNTER — Other Ambulatory Visit: Payer: Self-pay | Admitting: Physician Assistant

## 2019-04-14 DIAGNOSIS — M545 Low back pain, unspecified: Secondary | ICD-10-CM

## 2019-04-14 DIAGNOSIS — G8929 Other chronic pain: Secondary | ICD-10-CM

## 2019-07-11 ENCOUNTER — Other Ambulatory Visit (INDEPENDENT_AMBULATORY_CARE_PROVIDER_SITE_OTHER): Payer: BC Managed Care – PPO

## 2019-07-11 ENCOUNTER — Other Ambulatory Visit: Payer: Self-pay | Admitting: *Deleted

## 2019-07-11 DIAGNOSIS — Z0000 Encounter for general adult medical examination without abnormal findings: Secondary | ICD-10-CM

## 2019-07-11 DIAGNOSIS — Z780 Asymptomatic menopausal state: Secondary | ICD-10-CM | POA: Diagnosis not present

## 2019-07-11 MED ORDER — ALENDRONATE SODIUM 70 MG PO TABS: 70.0000 mg | ORAL_TABLET | ORAL | 11 refills | Status: DC

## 2019-08-09 ENCOUNTER — Encounter: Payer: Self-pay | Admitting: Physician Assistant

## 2019-08-10 ENCOUNTER — Encounter: Payer: Self-pay | Admitting: Physician Assistant

## 2019-09-21 ENCOUNTER — Other Ambulatory Visit: Payer: Self-pay | Admitting: Physician Assistant

## 2019-09-21 DIAGNOSIS — F3342 Major depressive disorder, recurrent, in full remission: Secondary | ICD-10-CM

## 2019-11-23 ENCOUNTER — Ambulatory Visit (INDEPENDENT_AMBULATORY_CARE_PROVIDER_SITE_OTHER): Payer: BC Managed Care – PPO | Admitting: Family Medicine

## 2019-11-23 ENCOUNTER — Encounter: Payer: Self-pay | Admitting: Family Medicine

## 2019-11-23 VITALS — BP 117/70 | HR 67 | Temp 97.8°F | Ht 65.0 in | Wt 189.0 lb

## 2019-11-23 DIAGNOSIS — Z0001 Encounter for general adult medical examination with abnormal findings: Secondary | ICD-10-CM

## 2019-11-23 DIAGNOSIS — M810 Age-related osteoporosis without current pathological fracture: Secondary | ICD-10-CM

## 2019-11-23 DIAGNOSIS — E7800 Pure hypercholesterolemia, unspecified: Secondary | ICD-10-CM | POA: Diagnosis not present

## 2019-11-23 DIAGNOSIS — Z0000 Encounter for general adult medical examination without abnormal findings: Secondary | ICD-10-CM

## 2019-11-23 DIAGNOSIS — E559 Vitamin D deficiency, unspecified: Secondary | ICD-10-CM

## 2019-11-23 DIAGNOSIS — R5383 Other fatigue: Secondary | ICD-10-CM | POA: Diagnosis not present

## 2019-11-23 DIAGNOSIS — G2581 Restless legs syndrome: Secondary | ICD-10-CM | POA: Diagnosis not present

## 2019-11-23 DIAGNOSIS — Z130 Encounter for screening for diseases of the blood and blood-forming organs and certain disorders involving the immune mechanism: Secondary | ICD-10-CM | POA: Diagnosis not present

## 2019-11-23 DIAGNOSIS — Z6832 Body mass index (BMI) 32.0-32.9, adult: Secondary | ICD-10-CM

## 2019-11-23 MED ORDER — SERTRALINE HCL 25 MG PO TABS: ORAL_TABLET | ORAL | 0 refills | Status: DC

## 2019-11-23 NOTE — Progress Notes (Signed)
Susan Whitney is a 59 y.o. female presents to office today for annual physical exam examination.    Concerns today include: 1.  Depressive disorder Patient reports that she has been on Zoloft for several years now.  She would like to come off this medicine if she does not have any depressive or anxiety symptoms.  She took her last 50 mg pill on Monday.  She is not having any withdrawal symptoms.  She was unsure if she could just abruptly discontinue if she needed to taper.  2.  Insomnia/restless leg Patient was previously diagnosed with restless leg and treated with Requip.  She "does not think that she gave the medication a chance" but ultimately discontinued it for ineffectiveness.  She does admit to drinking 4 to 5 cups of coffee per day with last cup of coffee between 230 and 3 PM.  She does not report excessive fatigue around this time which ultimately leads to her drinking more more caffeine.  She also reports increased sugar intake over the last year.  She describes restless legs as tingling, crawling sensation bilateral legs causing her to move them.  This frequently causes nighttime awakenings.  She tried melatonin but this has not been effective in helping induce sleep.  3.  Osteoporosis Patient found to have osteoporosis on recent DEXA scan.  She is started on Fosamax.  Unfortunately, no labs were drawn prior to initiation.  She continues to have upper shoulder aching that seems to be worse over the last year.  She denies intake of sufficient vitamin D and calcium, citing that she mostly only gets these through coffee creamer in her coffee.  No specific weightbearing exercise.  Occupation: Chartered certified accountant, Marital status: married, Substance use: none Diet: fair, Exercise: no structured Last eye exam: UTD Last dental exam: UTD Last colonoscopy: UTD Last mammogram: need Last pap smear: needs, sees Maud needed today: none Immunizations needed: UTD  Past  Medical History:  Diagnosis Date  . Anemia   . Anxiety   . Osteopenia   . RLS (restless legs syndrome)    Social History   Socioeconomic History  . Marital status: Married    Spouse name: Not on file  . Number of children: Not on file  . Years of education: Not on file  . Highest education level: Not on file  Occupational History  . Not on file  Tobacco Use  . Smoking status: Never Smoker  . Smokeless tobacco: Never Used  Substance and Sexual Activity  . Alcohol use: No  . Drug use: No  . Sexual activity: Not on file  Other Topics Concern  . Not on file  Social History Narrative  . Not on file   Social Determinants of Health   Financial Resource Strain:   . Difficulty of Paying Living Expenses:   Food Insecurity:   . Worried About Charity fundraiser in the Last Year:   . Arboriculturist in the Last Year:   Transportation Needs:   . Film/video editor (Medical):   Marland Kitchen Lack of Transportation (Non-Medical):   Physical Activity:   . Days of Exercise per Week:   . Minutes of Exercise per Session:   Stress:   . Feeling of Stress :   Social Connections:   . Frequency of Communication with Friends and Family:   . Frequency of Social Gatherings with Friends and Family:   . Attends Religious Services:   . Active Member  of Clubs or Organizations:   . Attends Club or Organization Meetings:   . Marital Status:   Intimate Partner Violence:   . Fear of Current or Ex-Partner:   . Emotionally Abused:   . Physically Abused:   . Sexually Abused:    Past Surgical History:  Procedure Laterality Date  . ABDOMINAL HYSTERECTOMY    . CHOLECYSTECTOMY    . FOOT NEUROMA SURGERY Right 09/2014  . SINUS IRRIGATION  2016  . TUBAL LIGATION     Family History  Problem Relation Age of Onset  . Emphysema Mother        smoked  . Hip fracture Mother   . Colon cancer Father   . Hypertension Sister   . Hypertension Brother     Current Outpatient Medications:  .  alendronate  (FOSAMAX) 70 MG tablet, Take 1 tablet (70 mg total) by mouth every 7 (seven) days. Take with a full glass of water on an empty stomach., Disp: 4 tablet, Rfl: 11 .  ALPRAZolam (XANAX) 0.25 MG tablet, TAKE 1 TABLET BY MOUTH DAILY AS NEEDED FOR ANXIETY, Disp: 30 tablet, Rfl: 0 .  Black Cohosh 160 MG CAPS, black cohosh, Disp: , Rfl:  .  cetirizine (ZYRTEC) 10 MG tablet, Take 1 tablet (10 mg total) by mouth daily., Disp: 30 tablet, Rfl: 11 .  fluticasone (FLONASE) 50 MCG/ACT nasal spray, Place 2 sprays into both nostrils daily., Disp: 16 g, Rfl: 6 .  meloxicam (MOBIC) 7.5 MG tablet, TAKE 1 TABLET BY MOUTH EVERY DAY, Disp: 30 tablet, Rfl: 0 .  sertraline (ZOLOFT) 50 MG tablet, TAKE 1 TABLET BY MOUTH EVERY DAY, Disp: 90 tablet, Rfl: 3  No Known Allergies   ROS: Review of Systems Pertinent items noted in HPI and remainder of comprehensive ROS otherwise negative.    Physical exam BP 117/70   Pulse 67   Temp 97.8 F (36.6 C) (Temporal)   Ht 5' 5" (1.651 m)   Wt 189 lb (85.7 kg)   SpO2 98%   BMI 31.45 kg/m  General appearance: alert, cooperative, appears stated age and no distress Head: Normocephalic, without obvious abnormality, atraumatic Eyes: Sclera white.  Exotropia noted of the right eye.  PERRLA Ears: normal TM's and external ear canals both ears Nose: Nares normal. Septum midline. Mucosa normal. No drainage or sinus tenderness. Throat: lips, mucosa, and tongue normal; teeth and gums normal Neck: no adenopathy, no carotid bruit, no JVD, supple, symmetrical, trachea midline and thyroid not enlarged, symmetric, no tenderness/mass/nodules Back: symmetric, no curvature. ROM normal. No CVA tenderness. Lungs: clear to auscultation bilaterally Heart: regular rate and rhythm, S1, S2 normal, no murmur, click, rub or gallop Abdomen: soft, non-tender; bowel sounds normal; no masses,  no organomegaly Extremities: extremities normal, atraumatic, no cyanosis or edema Pulses: 2+ and symmetric Skin:  Various pigmented nevi but no concerning lesions noted on the back. Lymph nodes: Cervical, supraclavicular, and axillary nodes normal. Neurologic: Alert and oriented X 3, normal strength and tone. Normal symmetric reflexes. Normal coordination and gait Psych: Mood stable, speech not, affect appropriate, pleasant and interactive Depression screen PHQ 2/9 11/23/2019 02/23/2019 01/10/2019  Decreased Interest 0 0 0  Down, Depressed, Hopeless 0 0 0  PHQ - 2 Score 0 0 0  Altered sleeping 3 - -  Tired, decreased energy 1 - -  Change in appetite 1 - -  Feeling bad or failure about yourself  0 - -  Trouble concentrating 2 - -  Moving slowly or fidgety/restless 0 - -    Suicidal thoughts 0 - -  PHQ-9 Score 7 - -  Difficult doing work/chores Not difficult at all - -   No flowsheet data found.   Assessment/ Plan: Zaleah A Krage here for annual physical exam.   1. Annual physical exam Will get records from [facility]. She will set up mammogram  2. Pure hypercholesterolemia Check fasting labs - CMP14+EGFR - Lipid Panel - TSH  3. BMI 32.0-32.9,adult  4. Age-related osteoporosis without current pathological fracture Check vitamin D, Ca. Handout provided with osteoporosis recommendations - CMP14+EGFR - VITAMIN D 25 Hydroxy (Vit-D Deficiency, Fractures) - TSH  5. Vitamin D deficiency - VITAMIN D 25 Hydroxy (Vit-D Deficiency, Fractures)  6. Screening, anemia, deficiency, iron - CBC  7. Restless legs syndrome (RLS) ?excessive caffeine use, inadequate water intake.  Check metabolic labs.  Considering Mirapex for treatment - Vitamin B12 - Ferritin  8. Fatigue, unspecified type - CMP14+EGFR - CBC - TSH - Vitamin B12   Counseled on healthy lifestyle choices, including diet (rich in fruits, vegetables and lean meats and low in salt and simple carbohydrates) and exercise (at least 30 minutes of moderate physical activity daily).  Patient to follow up in 1 year for annual exam or  sooner if needed.  Ashly M. Gottschalk, DO      

## 2019-11-23 NOTE — Patient Instructions (Signed)
Get mammogram scheduled.  You had labs performed today.  You will be contacted with the results of the labs once they are available, usually in the next 3 business days for routine lab work.  If you have an active my chart account, they will be released to your MyChart.  If you prefer to have these labs released to you via telephone, please let us know.  If you had a pap smear or biopsy performed, expect to be contacted in about 7-10 days.   Preventive Care 59-59 Years Old, Female Preventive care refers to visits with your health care provider and lifestyle choices that can promote health and wellness. This includes:  A yearly physical exam. This may also be called an annual well check.  Regular dental visits and eye exams.  Immunizations.  Screening for certain conditions.  Healthy lifestyle choices, such as eating a healthy diet, getting regular exercise, not using drugs or products that contain nicotine and tobacco, and limiting alcohol use. What can I expect for my preventive care visit? Physical exam Your health care provider will check your:  Height and weight. This may be used to calculate body mass index (BMI), which tells if you are at a healthy weight.  Heart rate and blood pressure.  Skin for abnormal spots. Counseling Your health care provider may ask you questions about your:  Alcohol, tobacco, and drug use.  Emotional well-being.  Home and relationship well-being.  Sexual activity.  Eating habits.  Work and work Statistician.  Method of birth control.  Menstrual cycle.  Pregnancy history. What immunizations do I need?  Influenza (flu) vaccine  This is recommended every year. Tetanus, diphtheria, and pertussis (Tdap) vaccine  You may need a Td booster every 10 years. Varicella (chickenpox) vaccine  You may need this if you have not been vaccinated. Zoster (shingles) vaccine  You may need this after age 59. Measles, mumps, and rubella (MMR)  vaccine  You may need at least one dose of MMR if you were born in 1957 or later. You may also need a second dose. Pneumococcal conjugate (PCV13) vaccine  You may need this if you have certain conditions and were not previously vaccinated. Pneumococcal polysaccharide (PPSV23) vaccine  You may need one or two doses if you smoke cigarettes or if you have certain conditions. Meningococcal conjugate (MenACWY) vaccine  You may need this if you have certain conditions. Hepatitis A vaccine  You may need this if you have certain conditions or if you travel or work in places where you may be exposed to hepatitis A. Hepatitis B vaccine  You may need this if you have certain conditions or if you travel or work in places where you may be exposed to hepatitis B. Haemophilus influenzae type b (Hib) vaccine  You may need this if you have certain conditions. Human papillomavirus (HPV) vaccine  If recommended by your health care provider, you may need three doses over 6 months. You may receive vaccines as individual doses or as more than one vaccine together in one shot (combination vaccines). Talk with your health care provider about the risks and benefits of combination vaccines. What tests do I need? Blood tests  Lipid and cholesterol levels. These may be checked every 5 years, or more frequently if you are over 18 years old.  Hepatitis C test.  Hepatitis B test. Screening  Lung cancer screening. You may have this screening every year starting at age 59 if you have a 30-pack-year history of smoking  and currently smoke or have quit within the past 15 years.  Colorectal cancer screening. All adults should have this screening starting at age 59 and continuing until age 59. Your health care provider may recommend screening at age 34 if you are at increased risk. You will have tests every 1-10 years, depending on your results and the type of screening test.  Diabetes screening. This is done by  checking your blood sugar (glucose) after you have not eaten for a while (fasting). You may have this done every 1-3 years.  Mammogram. This may be done every 1-2 years. Talk with your health care provider about when you should start having regular mammograms. This may depend on whether you have a family history of breast cancer.  BRCA-related cancer screening. This may be done if you have a family history of breast, ovarian, tubal, or peritoneal cancers.  Pelvic exam and Pap test. This may be done every 3 years starting at age 59. Starting at age 66, this may be done every 5 years if you have a Pap test in combination with an HPV test. Other tests  Sexually transmitted disease (STD) testing.  Bone density scan. This is done to screen for osteoporosis. You may have this scan if you are at high risk for osteoporosis. Follow these instructions at home: Eating and drinking  Eat a diet that includes fresh fruits and vegetables, whole grains, lean protein, and low-fat dairy.  Take vitamin and mineral supplements as recommended by your health care provider.  Do not drink alcohol if: ? Your health care provider tells you not to drink. ? You are pregnant, may be pregnant, or are planning to become pregnant.  If you drink alcohol: ? Limit how much you have to 0-1 drink a day. ? Be aware of how much alcohol is in your drink. In the [place] one drink equals one 12 oz bottle of beer (355 mL), one 5 oz glass of wine (148 mL), or one 1 oz glass of hard liquor (44 mL). Lifestyle  Take daily care of your teeth and gums.  Stay active. Exercise for at least 30 minutes on 5 or more days each week.  Do not use any products that contain nicotine or tobacco, such as cigarettes, e-cigarettes, and chewing tobacco. If you need help quitting, ask your health care provider.  If you are sexually active, practice safe sex. Use a condom or other form of birth control (contraception) in order to prevent pregnancy  and STIs (sexually transmitted infections).  If told by your health care provider, take low-dose aspirin daily starting at age 59. What's next?  Visit your health care provider once a year for a well check visit.  Ask your health care provider how often you should have your eyes and teeth checked.  Stay up to date on all vaccines. This information is not intended to replace advice given to you by your health care provider. Make sure you discuss any questions you have with your health care provider. Document Revised: 03/18/2018 Document Reviewed: 03/18/2018 Elsevier Patient Education  2020 [employer].

## 2019-11-24 LAB — CBC
Hematocrit: 44.3 % (ref 34.0–46.6)
Hemoglobin: 13.8 g/dL (ref 11.1–15.9)
MCH: 25.6 pg — ABNORMAL LOW (ref 26.6–33.0)
MCHC: 31.2 g/dL — ABNORMAL LOW (ref 31.5–35.7)
MCV: 82 fL (ref 79–97)
Platelets: 354 10*3/uL (ref 150–450)
RBC: 5.39 x10E6/uL — ABNORMAL HIGH (ref 3.77–5.28)
RDW: 15.9 % — ABNORMAL HIGH (ref 11.7–15.4)
WBC: 7.6 10*3/uL (ref 3.4–10.8)

## 2019-11-24 LAB — CMP14+EGFR
ALT: 15 IU/L (ref 0–32)
AST: 28 IU/L (ref 0–40)
Albumin/Globulin Ratio: 1.7 (ref 1.2–2.2)
Albumin: 4.5 g/dL (ref 3.8–4.9)
Alkaline Phosphatase: 102 IU/L (ref 39–117)
BUN/Creatinine Ratio: 20 (ref 9–23)
BUN: 12 mg/dL (ref 6–24)
Bilirubin Total: 0.2 mg/dL (ref 0.0–1.2)
CO2: 21 mmol/L (ref 20–29)
Calcium: 9.2 mg/dL (ref 8.7–10.2)
Chloride: 104 mmol/L (ref 96–106)
Creatinine, Ser: 0.6 mg/dL (ref 0.57–1.00)
GFR calc Af Amer: 116 mL/min/{1.73_m2} (ref >59)
GFR calc non Af Amer: 101 mL/min/{1.73_m2} (ref >59)
Globulin, Total: 2.7 g/dL (ref 1.5–4.5)
Glucose: 83 mg/dL (ref 65–99)
Potassium: 4.6 mmol/L (ref 3.5–5.2)
Sodium: 141 mmol/L (ref 134–144)
Total Protein: 7.2 g/dL (ref 6.0–8.5)

## 2019-11-24 LAB — LIPID PANEL
Chol/HDL Ratio: 2.9 ratio (ref 0.0–4.4)
Cholesterol, Total: 203 mg/dL — ABNORMAL HIGH (ref 100–199)
HDL: 69 mg/dL (ref >39)
LDL Chol Calc (NIH): 123 mg/dL — ABNORMAL HIGH (ref 0–99)
Triglycerides: 62 mg/dL (ref 0–149)
VLDL Cholesterol Cal: 11 mg/dL (ref 5–40)

## 2019-11-24 LAB — TSH: TSH: 2.3 u[IU]/mL (ref 0.450–4.500)

## 2019-11-24 LAB — VITAMIN D 25 HYDROXY (VIT D DEFICIENCY, FRACTURES): Vit D, 25-Hydroxy: 19.4 ng/mL — ABNORMAL LOW (ref 30.0–100.0)

## 2019-11-25 ENCOUNTER — Other Ambulatory Visit: Payer: Self-pay | Admitting: Family Medicine

## 2019-11-25 DIAGNOSIS — E611 Iron deficiency: Secondary | ICD-10-CM

## 2019-11-25 DIAGNOSIS — E559 Vitamin D deficiency, unspecified: Secondary | ICD-10-CM

## 2019-11-25 LAB — SPECIMEN STATUS REPORT

## 2019-11-25 LAB — VITAMIN B12: Vitamin B-12: 381 pg/mL (ref 232–1245)

## 2019-11-25 LAB — FERRITIN: Ferritin: 14 ng/mL — ABNORMAL LOW (ref 15–150)

## 2019-11-25 MED ORDER — CHOLECALCIFEROL 1.25 MG (50000 UT) PO CAPS
50000.0000 [IU] | ORAL_CAPSULE | ORAL | 0 refills | Status: AC
Start: 2019-11-25 — End: 2020-01-14

## 2019-12-15 ENCOUNTER — Other Ambulatory Visit: Payer: Self-pay | Admitting: Family Medicine

## 2020-01-16 ENCOUNTER — Other Ambulatory Visit: Payer: Self-pay | Admitting: Family Medicine

## 2020-01-18 ENCOUNTER — Encounter: Payer: Self-pay | Admitting: Family Medicine

## 2020-01-18 ENCOUNTER — Ambulatory Visit (INDEPENDENT_AMBULATORY_CARE_PROVIDER_SITE_OTHER): Payer: BC Managed Care – PPO | Admitting: Family Medicine

## 2020-01-18 DIAGNOSIS — J069 Acute upper respiratory infection, unspecified: Secondary | ICD-10-CM | POA: Diagnosis not present

## 2020-01-18 MED ORDER — FLUCONAZOLE 150 MG PO TABS
150.0000 mg | ORAL_TABLET | Freq: Once | ORAL | 0 refills | Status: AC
Start: 2020-01-18 — End: 2020-01-18

## 2020-01-18 MED ORDER — GUAIFENESIN-CODEINE 100-10 MG/5ML PO SOLN: 5.0000 mL | Freq: Four times a day (QID) | ORAL | 0 refills | Status: DC | PRN

## 2020-01-18 MED ORDER — CEFDINIR 300 MG PO CAPS: 300.0000 mg | ORAL_CAPSULE | Freq: Two times a day (BID) | ORAL | 0 refills | Status: DC

## 2020-01-18 MED ORDER — BENZONATATE 100 MG PO CAPS: 100.0000 mg | ORAL_CAPSULE | Freq: Three times a day (TID) | ORAL | 0 refills | Status: DC | PRN

## 2020-01-18 MED ORDER — AZELASTINE HCL 0.1 % NA SOLN: 1.0000 | Freq: Two times a day (BID) | NASAL | 12 refills | Status: DC

## 2020-01-18 NOTE — Progress Notes (Signed)
Subjective: CC: URI PCP: Janora Norlander, DO HPI:Susan Whitney is a 59 y.o. female presenting to clinic today for:  1.  URI with cough and congestion Patient reports onset over the weekend.  She reports productive cough with greenish sputum.  She has copious amounts of drainage which is causing irritation of the throat.  She denies any shortness of breath, hemoptysis.  No fevers.  She was tested for COVID-19 and this was negative.  Of note she is vaccinated against COVID-19 as well.  She has been taking OTC products with little improvement in above symptoms.  Her nasal congestion is somewhat improving though.   ROS: Per HPI  No Known Allergies Past Medical History:  Diagnosis Date  . Anemia   . Anxiety   . Osteopenia   . RLS (restless legs syndrome)     Current Outpatient Medications:  .  alendronate (FOSAMAX) 70 MG tablet, Take 1 tablet (70 mg total) by mouth every 7 (seven) days. Take with a full glass of water on an empty stomach., Disp: 4 tablet, Rfl: 11 .  Black Cohosh 160 MG CAPS, black cohosh, Disp: , Rfl:  .  cetirizine (ZYRTEC) 10 MG tablet, Take 1 tablet (10 mg total) by mouth daily., Disp: 30 tablet, Rfl: 11 .  meloxicam (MOBIC) 7.5 MG tablet, TAKE 1 TABLET BY MOUTH EVERY DAY, Disp: 30 tablet, Rfl: 0 .  sertraline (ZOLOFT) 25 MG tablet, TAKE 1 TABLET DAILY FOR 21 DAYS, THEN TAKE 1 TABLET EVERY OTHER DAY, Disp: 90 tablet, Rfl: 1 Social History   Socioeconomic History  . Marital status: Married    Spouse name: Not on file  . Number of children: Not on file  . Years of education: Not on file  . Highest education level: Not on file  Occupational History  . Not on file  Tobacco Use  . Smoking status: Never Smoker  . Smokeless tobacco: Never Used  Vaping Use  . Vaping Use: Never used  Substance and Sexual Activity  . Alcohol use: No  . Drug use: No  . Sexual activity: Not on file  Other Topics Concern  . Not on file  Social History Narrative  . Not on file     Social Determinants of Health   Financial Resource Strain:   . Difficulty of Paying Living Expenses:   Food Insecurity:   . Worried About Charity fundraiser in the Last Year:   . Arboriculturist in the Last Year:   Transportation Needs:   . Film/video editor (Medical):   Marland Kitchen Lack of Transportation (Non-Medical):   Physical Activity:   . Days of Exercise per Week:   . Minutes of Exercise per Session:   Stress:   . Feeling of Stress :   Social Connections:   . Frequency of Communication with Friends and Family:   . Frequency of Social Gatherings with Friends and Family:   . Attends Religious Services:   . Active Member of Clubs or Organizations:   . Attends Archivist Meetings:   Marland Kitchen Marital Status:   Intimate Partner Violence:   . Fear of Current or Ex-Partner:   . Emotionally Abused:   Marland Kitchen Physically Abused:   . Sexually Abused:    Family History  Problem Relation Age of Onset  . Emphysema Mother        smoked  . Hip fracture Mother   . Colon cancer Father   . Hypertension Sister   .  Hypertension Brother     Objective: Physical Examination:  General: Awake, alert, well nourished, No acute distress HEENT: Normal    Neck: No masses palpated. No lymphadenopathy    Ears: Tympanic membranes intact, normal light reflex, no erythema, no bulging    Eyes: PERRLA, extraocular membranes intact, sclera white    Nose: nasal turbinates moist, clear nasal discharge    Throat: moist mucus membranes, cobblestone appearance of the oropharynx noted but no overt erythema, no tonsillar exudate.  Airway is patent Cardio: regular rate and rhythm, S1S2 heard, no murmurs appreciated Pulm: clear to auscultation bilaterally, no wheezes, rhonchi or rales; normal work of breathing on room air  Assessment/ Plan: 59 y.o. female   1. URI with cough and congestion Again I think this is viral but because she is going on vacation this week and I have given her a pocket prescription  for Omnicef.  I have sent in Astelin nasal spray for her nasal drainage, which is likely precipitating much of her cough.  I have also sent in [facility] for daytime use with guaifenesin AC for nighttime use.  Caution sedation.  National narcotic database was reviewed and there were no red flags.  She will follow-up as needed. - azelastine (ASTELIN) 0.1 % nasal spray; Place 1 spray into both nostrils 2 (two) times daily.  Dispense: 30 mL; Refill: 12 - cefdinir (OMNICEF) 300 MG capsule; Take 1 capsule (300 mg total) by mouth 2 (two) times daily. 1 po BID  Dispense: 20 capsule; Refill: 0 - guaiFENesin-codeine 100-10 MG/5ML syrup; Take 5 mLs by mouth every 6 (six) hours as needed for cough.  Dispense: 120 mL; Refill: 0 - benzonatate (TESSALON PERLES) 100 MG capsule; Take 1 capsule (100 mg total) by mouth 3 (three) times daily as needed for cough.  Dispense: 20 capsule; Refill: 0   No orders of the defined types were placed in this encounter.  No orders of the defined types were placed in this encounter.    Janora Norlander, DO [place] [phone]

## 2020-01-18 NOTE — Patient Instructions (Signed)

## 2020-01-20 ENCOUNTER — Encounter: Payer: Self-pay | Admitting: Family Medicine

## 2020-01-20 ENCOUNTER — Other Ambulatory Visit: Payer: Self-pay | Admitting: Family Medicine

## 2020-01-20 MED ORDER — SERTRALINE HCL 50 MG PO TABS: 50.0000 mg | ORAL_TABLET | Freq: Every day | ORAL | 0 refills | Status: DC

## 2020-02-14 ENCOUNTER — Ambulatory Visit: Payer: BC Managed Care – PPO

## 2020-02-20 ENCOUNTER — Other Ambulatory Visit: Payer: Self-pay

## 2020-02-20 ENCOUNTER — Encounter: Payer: Self-pay | Admitting: Family Medicine

## 2020-02-20 ENCOUNTER — Ambulatory Visit: Payer: BC Managed Care – PPO | Admitting: Family Medicine

## 2020-02-20 VITALS — BP 111/70 | HR 74 | Temp 97.7°F | Ht 65.0 in | Wt 203.6 lb

## 2020-02-20 DIAGNOSIS — M545 Low back pain, unspecified: Secondary | ICD-10-CM

## 2020-02-20 DIAGNOSIS — M7061 Trochanteric bursitis, right hip: Secondary | ICD-10-CM

## 2020-02-20 DIAGNOSIS — M7062 Trochanteric bursitis, left hip: Secondary | ICD-10-CM

## 2020-02-20 DIAGNOSIS — G8929 Other chronic pain: Secondary | ICD-10-CM | POA: Diagnosis not present

## 2020-02-20 MED ORDER — MELOXICAM 7.5 MG PO TABS: 7.5000 mg | ORAL_TABLET | Freq: Every day | ORAL | 2 refills | Status: DC | PRN

## 2020-02-20 NOTE — Patient Instructions (Signed)
Meloxicam 7.5-15mg  daily with food.  AWAY from sertraline.  Ice.  Physical therapy exercises.  If no significant improvement in next 2-4 weeks, we can do a steroid injection

## 2020-02-20 NOTE — Progress Notes (Signed)
Subjective: CC: Hip pain PCP: Janora Norlander, DO HPI:Susan Whitney is a 59 y.o. female presenting to clinic today for:  1.  Hip pain Patient reports 1+ year history of bilateral hip pain.  She points to the area of the trochanteric bursa bilaterally.  She notes that pain is worse when she lies on that side.  Sometimes it wakes her from sleep.  She denies any radiation of pain to the groin but sometimes will have slight pain down into the lateral thigh.  No preceding injury.  She has had imaging of her back previously which did not show any significant degenerative changes.  Though she does sometimes have a low back pain that she attributes to arthritis.  She has used Tylenol but this has not been especially helpful.  Not using any meloxicam or other oral NSAIDs.  No heat or ice.  No stretches.   ROS: Per HPI  No Known Allergies Past Medical History:  Diagnosis Date   Anemia    Anxiety    Osteopenia    RLS (restless legs syndrome)     Current Outpatient Medications:    alendronate (FOSAMAX) 70 MG tablet, Take 1 tablet (70 mg total) by mouth every 7 (seven) days. Take with a full glass of water on an empty stomach., Disp: 4 tablet, Rfl: 11   azelastine (ASTELIN) 0.1 % nasal spray, Place 1 spray into both nostrils 2 (two) times daily., Disp: 30 mL, Rfl: 12   benzonatate (TESSALON PERLES) 100 MG capsule, Take 1 capsule (100 mg total) by mouth 3 (three) times daily as needed for cough., Disp: 20 capsule, Rfl: 0   Black Cohosh 160 MG CAPS, black cohosh, Disp: , Rfl:    cefdinir (OMNICEF) 300 MG capsule, Take 1 capsule (300 mg total) by mouth 2 (two) times daily. 1 po BID, Disp: 20 capsule, Rfl: 0   cetirizine (ZYRTEC) 10 MG tablet, Take 1 tablet (10 mg total) by mouth daily., Disp: 30 tablet, Rfl: 11   guaiFENesin-codeine 100-10 MG/5ML syrup, Take 5 mLs by mouth every 6 (six) hours as needed for cough., Disp: 120 mL, Rfl: 0   meloxicam (MOBIC) 7.5 MG tablet, TAKE 1 TABLET BY  MOUTH EVERY DAY, Disp: 30 tablet, Rfl: 0   sertraline (ZOLOFT) 50 MG tablet, Take 1 tablet (50 mg total) by mouth daily., Disp: 90 tablet, Rfl: 0 Social History   Socioeconomic History   Marital status: Married    Spouse name: Not on file   Number of children: Not on file   Years of education: Not on file   Highest education level: Not on file  Occupational History   Not on file  Tobacco Use   Smoking status: Never Smoker   Smokeless tobacco: Never Used  Vaping Use   Vaping Use: Never used  Substance and Sexual Activity   Alcohol use: No   Drug use: No   Sexual activity: Not on file  Other Topics Concern   Not on file  Social History Narrative   Not on file   Social Determinants of Health   Financial Resource Strain:    Difficulty of Paying Living Expenses:   Food Insecurity:    Worried About [place] in the Last Year:    Arboriculturist in the Last Year:   Transportation Needs:    Film/video editor (Medical):    Lack of Transportation (Non-Medical):   Physical Activity:    Days of Exercise per Week:  Minutes of Exercise per Session:   Stress:    Feeling of Stress :   Social Connections:    Frequency of Communication with Friends and Family:    Frequency of Social Gatherings with Friends and Family:    Attends Religious Services:    Active Member of Clubs or Organizations:    Attends Music therapist:    Marital Status:   Intimate Partner Violence:    Fear of Current or Ex-Partner:    Emotionally Abused:    Physically Abused:    Sexually Abused:    Family History  Problem Relation Age of Onset   Emphysema Mother        smoked   Hip fracture Mother    Colon cancer Father    Hypertension Sister    Hypertension Brother     Objective: Office vital signs reviewed. BP 111/70    Pulse 74    Temp 97.7 F (36.5 C)    Ht 5\' 5"  (1.651 m)    Wt 203 lb 9.6 oz (92.4 kg)    SpO2 98%    BMI 33.88  kg/m   Physical Examination:  General: Awake, alert, No acute distress MSK: normla gait and station  Hip: She has tenderness palpation over the trochanteric bursa bilaterally.  There are no palpable abnormalities, fluctuance.  Assessment/ Plan: 59 y.o. female   1. Trochanteric bursitis of both hips Physical exam suggestive of trochanteric bursitis.  I given her home physical therapy exercises to stretch her IT band.  I have recommended oral NSAID and prescribed meloxicam.  Caution GI bleed with SSRI.  Use ice to the affected area.  If no significant improvement within the next 2 to 4 weeks, we will proceed with corticosteroid injection.  I reviewed her last lumbar film which did not show any significant degenerative changes - meloxicam (MOBIC) 7.5 MG tablet; Take 1-2 tablets (7.5-15 mg total) by mouth daily as needed for pain.  Dispense: 60 tablet; Refill: 2   No orders of the defined types were placed in this encounter.  No orders of the defined types were placed in this encounter.    Janora Norlander, DO [place] [phone]

## 2020-04-30 ENCOUNTER — Telehealth: Payer: Self-pay | Admitting: *Deleted

## 2020-04-30 MED ORDER — SERTRALINE HCL 50 MG PO TABS: 50.0000 mg | ORAL_TABLET | Freq: Every day | ORAL | 0 refills | Status: DC

## 2020-04-30 NOTE — Telephone Encounter (Signed)
Pt aware refill sent to pharmacy 

## 2020-05-09 ENCOUNTER — Ambulatory Visit: Payer: Self-pay | Admitting: Ophthalmology

## 2020-05-09 MED ORDER — KETOROLAC TROMETHAMINE 15 MG/ML IJ SOLN: 15.0000 mg | Freq: Once | INTRAMUSCULAR | Status: AC

## 2020-05-09 NOTE — H&P (Signed)
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 Anemia   . Anxiety   . Osteopenia   . RLS (restless legs syndrome)     Pertinent ocular history:  Infantile cataract of the right eye was not treated, resulting in severe amblyopia of the right eye. Progressive exotropia has been noted over the last decade; patient desires for it to be fixed due to significant pain by the end of each day, and a "pulling" sensation.  Pertinent family history:  Family History  Problem Relation Age of Onset  . Emphysema Mother        smoked  . Hip fracture Mother   . Colon cancer Father   . Hypertension Sister   . Hypertension Brother     General:  Healthy appearing patient in no distress.    Eyes:    Acuity OD CF 4'  OS 20/20  cc  External: Within normal limits     Anterior segment: Dense posterior polar cataract visible OD; Within normal limits OS  Motility:   55pd right exotropia; comitant  Fundus: no view OD; macula flat, disc unremarkable OS; cleared by general ophtho with complete eye exam at their office  Impression: 59yo female with dense amblyopia of the right eye and resultant large angle exotropia that has become painful  Plan: Strabismus surgery right eye  M. Lenox Ahr, MD

## 2020-05-09 NOTE — H&P (Deleted)
  The note originally documented on this encounter has been moved the the encounter in which it belongs.  

## 2020-05-10 ENCOUNTER — Encounter (HOSPITAL_BASED_OUTPATIENT_CLINIC_OR_DEPARTMENT_OTHER): Payer: Self-pay | Admitting: Ophthalmology

## 2020-05-10 ENCOUNTER — Other Ambulatory Visit: Payer: Self-pay

## 2020-05-16 ENCOUNTER — Other Ambulatory Visit (HOSPITAL_COMMUNITY)
Admission: RE | Admit: 2020-05-16 | Discharge: 2020-05-16 | Disposition: A | Discharge status: Home / Self Care | Payer: BC Managed Care – PPO | Source: Ambulatory Visit | Attending: Ophthalmology | Admitting: Ophthalmology

## 2020-05-16 DIAGNOSIS — Z20822 Contact with and (suspected) exposure to covid-19: Secondary | ICD-10-CM | POA: Insufficient documentation

## 2020-05-16 DIAGNOSIS — Z01812 Encounter for preprocedural laboratory examination: Secondary | ICD-10-CM | POA: Insufficient documentation

## 2020-05-16 LAB — SARS CORONAVIRUS 2 (TAT 6-24 HRS): SARS Coronavirus 2: NEGATIVE

## 2020-05-17 ENCOUNTER — Encounter (HOSPITAL_BASED_OUTPATIENT_CLINIC_OR_DEPARTMENT_OTHER)
Admission: RE | Disposition: A | Discharge status: Home / Self Care | Payer: Self-pay | Source: Home / Self Care | Attending: Ophthalmology

## 2020-05-17 ENCOUNTER — Ambulatory Visit (HOSPITAL_BASED_OUTPATIENT_CLINIC_OR_DEPARTMENT_OTHER): Payer: BC Managed Care – PPO | Admitting: Certified Registered"

## 2020-05-17 ENCOUNTER — Encounter (HOSPITAL_BASED_OUTPATIENT_CLINIC_OR_DEPARTMENT_OTHER): Payer: Self-pay | Admitting: Ophthalmology

## 2020-05-17 ENCOUNTER — Ambulatory Visit (HOSPITAL_BASED_OUTPATIENT_CLINIC_OR_DEPARTMENT_OTHER)
Admission: RE | Admit: 2020-05-17 | Discharge: 2020-05-17 | Disposition: A | Discharge status: Home / Self Care | Payer: BC Managed Care – PPO | Attending: Ophthalmology | Admitting: Ophthalmology

## 2020-05-17 ENCOUNTER — Other Ambulatory Visit: Payer: Self-pay

## 2020-05-17 DIAGNOSIS — H5010 Unspecified exotropia: Secondary | ICD-10-CM | POA: Insufficient documentation

## 2020-05-17 DIAGNOSIS — H53001 Unspecified amblyopia, right eye: Secondary | ICD-10-CM | POA: Diagnosis not present

## 2020-05-17 DIAGNOSIS — H268 Other specified cataract: Secondary | ICD-10-CM | POA: Insufficient documentation

## 2020-05-17 HISTORY — PX: STRABISMUS SURGERY: SHX218

## 2020-05-17 SURGERY — REPAIR STRABISMUS
Anesthesia: General | Site: Eye | Laterality: Right

## 2020-05-17 MED ORDER — LACTATED RINGERS IV SOLN: INTRAVENOUS | Status: DC

## 2020-05-17 MED ORDER — FENTANYL CITRATE (PF) 100 MCG/2ML IJ SOLN
INTRAMUSCULAR | Status: AC
  Filled 2020-05-17: qty 2

## 2020-05-17 MED ORDER — ONDANSETRON HCL 4 MG/2ML IJ SOLN
INTRAMUSCULAR | Status: AC
  Filled 2020-05-17: qty 2

## 2020-05-17 MED ORDER — PHENYLEPHRINE HCL 2.5 % OP SOLN
1.0000 [drp] | OPHTHALMIC | Status: AC | PRN
  Administered 2020-05-17: 1 [drp] via OPHTHALMIC

## 2020-05-17 MED ORDER — FENTANYL CITRATE (PF) 100 MCG/2ML IJ SOLN
INTRAMUSCULAR | Status: DC | PRN
  Administered 2020-05-17 (×2): 50 ug via INTRAVENOUS

## 2020-05-17 MED ORDER — PROPOFOL 10 MG/ML IV BOLUS
INTRAVENOUS | Status: DC | PRN
  Administered 2020-05-17: 200 mg via INTRAVENOUS

## 2020-05-17 MED ORDER — DEXAMETHASONE SODIUM PHOSPHATE 4 MG/ML IJ SOLN
INTRAMUSCULAR | Status: DC | PRN
  Administered 2020-05-17: 10 mg via INTRAVENOUS

## 2020-05-17 MED ORDER — PHENYLEPHRINE HCL 2.5 % OP SOLN
OPHTHALMIC | Status: AC
  Filled 2020-05-17: qty 2

## 2020-05-17 MED ORDER — BUPIVACAINE HCL 0.5 % IJ SOLN
INTRAMUSCULAR | Status: DC | PRN
  Administered 2020-05-17: 1.5 mL

## 2020-05-17 MED ORDER — FENTANYL CITRATE (PF) 100 MCG/2ML IJ SOLN: 25.0000 ug | INTRAMUSCULAR | Status: DC | PRN

## 2020-05-17 MED ORDER — ONDANSETRON HCL 4 MG/2ML IJ SOLN
INTRAMUSCULAR | Status: DC | PRN
  Administered 2020-05-17: 4 mg via INTRAVENOUS

## 2020-05-17 MED ORDER — LIDOCAINE 2% (20 MG/ML) 5 ML SYRINGE
INTRAMUSCULAR | Status: DC | PRN
  Administered 2020-05-17: 60 mg via INTRAVENOUS

## 2020-05-17 MED ORDER — OXYCODONE HCL 5 MG PO TABS: 5.0000 mg | ORAL_TABLET | Freq: Once | ORAL | Status: DC | PRN

## 2020-05-17 MED ORDER — EPHEDRINE SULFATE-NACL 50-0.9 MG/10ML-% IV SOSY
PREFILLED_SYRINGE | INTRAVENOUS | Status: DC | PRN
  Administered 2020-05-17: 10 mg via INTRAVENOUS

## 2020-05-17 MED ORDER — MIDAZOLAM HCL 2 MG/2ML IJ SOLN
INTRAMUSCULAR | Status: AC
  Filled 2020-05-17: qty 2

## 2020-05-17 MED ORDER — MAXITROL 3.5-10000-0.1 OP OINT: 1.0000 "application " | TOPICAL_OINTMENT | Freq: Four times a day (QID) | OPHTHALMIC | Status: DC

## 2020-05-17 MED ORDER — MIDAZOLAM HCL 5 MG/5ML IJ SOLN
INTRAMUSCULAR | Status: DC | PRN
  Administered 2020-05-17: 2 mg via INTRAVENOUS

## 2020-05-17 MED ORDER — BSS IO SOLN
INTRAOCULAR | Status: DC | PRN
  Administered 2020-05-17: 15 mL

## 2020-05-17 MED ORDER — LIDOCAINE 2% (20 MG/ML) 5 ML SYRINGE
INTRAMUSCULAR | Status: AC
  Filled 2020-05-17: qty 5

## 2020-05-17 MED ORDER — NEOMYCIN-POLYMYXIN-DEXAMETH 0.1 % OP OINT
TOPICAL_OINTMENT | OPHTHALMIC | Status: DC | PRN
  Administered 2020-05-17: 1 via OPHTHALMIC

## 2020-05-17 MED ORDER — OXYCODONE HCL 5 MG/5ML PO SOLN: 5.0000 mg | Freq: Once | ORAL | Status: DC | PRN

## 2020-05-17 MED ORDER — PROMETHAZINE HCL 25 MG/ML IJ SOLN: 6.2500 mg | INTRAMUSCULAR | Status: DC | PRN

## 2020-05-17 MED ORDER — KETOROLAC TROMETHAMINE 30 MG/ML IJ SOLN
INTRAMUSCULAR | Status: DC | PRN
  Administered 2020-05-17: 30 mg via INTRAVENOUS

## 2020-05-17 MED ORDER — DEXAMETHASONE SODIUM PHOSPHATE 10 MG/ML IJ SOLN
INTRAMUSCULAR | Status: AC
  Filled 2020-05-17: qty 1

## 2020-05-17 MED ORDER — KETOROLAC TROMETHAMINE 30 MG/ML IJ SOLN
INTRAMUSCULAR | Status: AC
  Filled 2020-05-17: qty 1

## 2020-05-17 SURGICAL SUPPLY — 33 items
APL SRG 3 HI ABS STRL LF PLS (MISCELLANEOUS) ×1
APL SWBSTK 6 STRL LF DISP (MISCELLANEOUS) ×4
APPLICATOR COTTON TIP 6 STRL (MISCELLANEOUS) ×4 IMPLANT
APPLICATOR COTTON TIP 6IN STRL (MISCELLANEOUS) ×12
APPLICATOR DR MATTHEWS STRL (MISCELLANEOUS) ×3 IMPLANT
BNDG EYE OVAL (GAUZE/BANDAGES/DRESSINGS) IMPLANT
CAUTERY EYE LOW TEMP 1300F FIN (OPHTHALMIC RELATED) IMPLANT
CLOSURE WOUND 1/4X4 (GAUZE/BANDAGES/DRESSINGS)
CORD BIPOLAR FORCEPS 12FT (ELECTRODE) ×3 IMPLANT
COVER BACK TABLE 60X90IN (DRAPES) ×3 IMPLANT
COVER MAYO STAND STRL (DRAPES) ×3 IMPLANT
DRAPE SURG 17X23 STRL (DRAPES) ×2 IMPLANT
DRAPE U-SHAPE 76X120 STRL (DRAPES) ×3 IMPLANT
GLOVE BIO SURGEON STRL SZ 6.5 (GLOVE) ×3 IMPLANT
GLOVE BIO SURGEON STRL SZ7 (GLOVE) ×3 IMPLANT
GLOVE BIO SURGEONS STRL SZ 6.5 (GLOVE) ×2
GOWN STRL REUS W/ TWL LRG LVL3 (GOWN DISPOSABLE) ×2 IMPLANT
GOWN STRL REUS W/TWL LRG LVL3 (GOWN DISPOSABLE) ×6
NS IRRIG 1000ML POUR BTL (IV SOLUTION) ×3 IMPLANT
PACK BASIN DAY SURGERY FS (CUSTOM PROCEDURE TRAY) ×3 IMPLANT
SHEILD EYE MED CORNL SHD 22X21 (OPHTHALMIC RELATED)
SHIELD EYE MED CORNL SHD 22X21 (OPHTHALMIC RELATED) IMPLANT
SPEAR EYE SURG WECK-CEL (MISCELLANEOUS) ×3 IMPLANT
STRIP CLOSURE SKIN 1/4X4 (GAUZE/BANDAGES/DRESSINGS) IMPLANT
SUT 6 0 SILK T G140 8DA (SUTURE) IMPLANT
SUT CHROMIC 7 0 TG140 8 (SUTURE) ×3 IMPLANT
SUT SILK 4 0 C 3 735G (SUTURE) IMPLANT
SUT VICRYL 6 0 S 28 (SUTURE) ×4 IMPLANT
SUT VICRYL ABS 6-0 S29 18IN (SUTURE) IMPLANT
SYR 10ML LL (SYRINGE) ×3 IMPLANT
SYR 3ML 18GX1 1/2 (SYRINGE) ×3 IMPLANT
TOWEL GREEN STERILE FF (TOWEL DISPOSABLE) ×3 IMPLANT
TRAY DSU PREP LF (CUSTOM PROCEDURE TRAY) ×3 IMPLANT

## 2020-05-17 NOTE — H&P (Signed)
Interval History and Physical Examination:  Susan Whitney  05/17/2020  Date of Initial H&P: 05/07/20   The patient has been reexamined and the H&P has been reviewed. The patient has no new complaints. The indications for today's procedure remain valid.  There is no change in the plan of care. There are no medical contraindications for proceeding with today's surgery and we will go forward as planned.  Thomes Cake, MD

## 2020-05-17 NOTE — Anesthesia Preprocedure Evaluation (Addendum)
Anesthesia Evaluation  Patient identified by MRN, date of birth, ID band Patient awake    Reviewed: Allergy & Precautions, NPO status , Patient's Chart, lab work & pertinent test results  History of Anesthesia Complications Negative for: history of anesthetic complications  Airway Mallampati: I  TM Distance: >3 FB Neck ROM: Full    Dental  (+) Dental Advisory Given, Teeth Intact   Pulmonary neg pulmonary ROS,    Pulmonary exam normal        Cardiovascular negative cardio ROS Normal cardiovascular exam     Neuro/Psych PSYCHIATRIC DISORDERS Anxiety Depression  RLS     GI/Hepatic negative GI ROS, Neg liver ROS,   Endo/Other   Obesity   Renal/GU negative Renal ROS     Musculoskeletal negative musculoskeletal ROS (+)   Abdominal   Peds  Hematology negative hematology ROS (+)   Anesthesia Other Findings Covid test negative   Reproductive/Obstetrics                            Anesthesia Physical Anesthesia Plan  ASA: II  Anesthesia Plan: General   Post-op Pain Management:    Induction: Intravenous  PONV Risk Score and Plan: 3 and Treatment may vary due to age or medical condition, Ondansetron, Dexamethasone and Midazolam  Airway Management Planned: LMA  Additional Equipment: None  Intra-op Plan:   Post-operative Plan: Extubation in OR  Informed Consent: I have reviewed the patients History and Physical, chart, labs and discussed the procedure including the risks, benefits and alternatives for the proposed anesthesia with the patient or authorized representative who has indicated his/her understanding and acceptance.     Dental advisory given  Plan Discussed with: CRNA and Anesthesiologist  Anesthesia Plan Comments:        Anesthesia Quick Evaluation

## 2020-05-17 NOTE — Anesthesia Procedure Notes (Signed)
Procedure Name: LMA Insertion Date/Time: 05/17/2020 9:20 AM Performed by: Lieutenant Diego, CRNA Pre-anesthesia Checklist: Patient identified, Emergency Drugs available, Suction available and Patient being monitored Patient Re-evaluated:Patient Re-evaluated prior to induction Oxygen Delivery Method: Circle system utilized Preoxygenation: Pre-oxygenation with 100% oxygen Induction Type: IV induction Ventilation: Mask ventilation without difficulty LMA: LMA inserted LMA Size: 4.0 Number of attempts: 1 Airway Equipment and Method: Bite block Placement Confirmation: positive ETCO2 and breath sounds checked- equal and bilateral Tube secured with: Tape Dental Injury: Teeth and Oropharynx as per pre-operative assessment

## 2020-05-17 NOTE — Op Note (Signed)
05/17/2020  10:11 AM  PATIENT:  Susan Whitney  59 y.o. female  PRE-OPERATIVE DIAGNOSIS:    1. Large-angle right exotropia  2. Dense amblyopia right eye  3. Infantile cataract right eye; never repaired, resulting in #2 and #1  POST-OPERATIVE DIAGNOSIS:    1. Large-angle right exotropia  2. Dense amblyopia right eye  3. Infantile cataract right eye; never repaired, resulting in #2 and #1  PROCEDURE:  1. Lateral rectus muscle recession 44mm right eye  2. Medial rectus plication 73mm right eye   SURGEON:  Annita Brod, M.D.   ANESTHESIA: General LMA and local subTenons bupivicaine  COMPLICATIONS: None immediate  DESCRIPTION OF PROCEDURE: The patient was taken to the operating room where She was identified by me. General anesthesia was induced without difficulty after placement of appropriate monitors. The patient was prepped and draped in the usual sterile ophthalmic fashion.  A lid speculum was placed in the right eye. Forced ductions were remarkable for mild restriction of adduction right eye. Through an inferotemporal fornix incision through conjunctiva and Tenon's fascia, the right lateral rectus muscle was engaged on a series of muscle hooks and cleared of its fascial attachments. The tendon was secured with a double-armed 6-0 Vicryl suture with a double locking bite at each border of the muscle, 1 mm from the insertion. The muscle was disinserted. The sclera behind the muscle belly was noted to be *extremely* thin and care was taken throughout the rest of the case to prevent undue traction on the globe, to prevent spontaneous rupture, which was a serious concern. Fortunately, the area where the muscle was to be reattached demonstrated sufficient scleral thickness to allow for reattachment. The muscle was therefore reattached to sclera at a measured distance of 10 millimeters posterior to the original insertion, using what was believed to be minimal effective depth with direct  scleral passes in crossed swords fashion. The suture ends were tied securely after the position of the muscle had been checked and found to be accurate. Conjunctiva was closed with 2 7-0 Chromic sutures.  The speculum was transferred to the left eye. Through an inferonasal fornix incision through conjunctiva and Tenon's fascia,the medial rectus muscle was found at its expected location 5.80mm posterior to the medial limbus.The muscle was engaged on a series of muscle hooks and cleared of fascial attachments. The muscle was secured with a double-armed 6-0 Vicryl suture with a double locking bite at each border of the muscle 31mm posterior to the insertion; sutures were brought forward and passed superior to the muscle, through the lateral portion of insertion and exiting the medial portion of the insertion, and back through the medial aspect of the muscle 50mm posterior to the insertion with special care taken to prevent severing of the posterior suture loop. Gentle traction was placed on the sutures with concurrent posterior motion to the muscle, resulting in a muscle loop posterior to the suture and an 32mm shortening of the medial rectus muscle. The suture ends were securely tied and cut. Conjunctiva was closed with 2 7-0 Chromic sutures.  A drop of dilute povidone iodine solution was placed on the eye. 1.54mL of bupivacaine 0.5% was diffused into the sub-Tenons space for perioperative anesthesia. The eye was rinsed with BSS. Maxitrol ointment was placed in the eye. The patient was awakened without difficulty and taken to the recovery room in stable condition, having suffered no intraoperative or immediate postoperative complications.  The family will be especially warned regarding ruptured globe precautions and  symptoms postoperatively.  Maryelizabeth Kaufmann.D.

## 2020-05-17 NOTE — Discharge Instructions (Signed)
Next dose of NSAIDS (Ibuprofen/Motrin/Aleve) can be given at 4:15pm if needed.   Post Anesthesia Home Care Instructions  Activity: Get plenty of rest for the remainder of the day. A responsible individual must stay with you for 24 hours following the procedure.  For the next 24 hours, DO NOT: -Drive a car -Paediatric nurse -Drink alcoholic beverages -Take any medication unless instructed by your physician -Make any legal decisions or sign important papers.  Meals: Start with liquid foods such as gelatin or soup. Progress to regular foods as tolerated. Avoid greasy, spicy, heavy foods. If nausea and/or vomiting occur, drink only clear liquids until the nausea and/or vomiting subsides. Call your physician if vomiting continues.  Special Instructions/Symptoms: Your throat may feel dry or sore from the anesthesia or the breathing tube placed in your throat during surgery. If this causes discomfort, gargle with warm salt water. The discomfort should disappear within 24 hours.      Diet: Clear liquids, advance to soft foods then regular diet as tolerated.  Pain control:   1)  Ibuprofen 600 mg by mouth every 6-8 hours as needed for pain  2)  Acetaminophen 325 one or two by mouth every 4-6 hours as needed for pain that is not resolved by ibuprofen; may alternate with ibuprofen every 2-3 hours for best results  This regimen has been clinically studied and proven as effective against pain as morphine.  Eye medications:  Antibiotic eye drops or ointment, one drop or application in the operated eye(s) 4 times a day for 10 days.    Activity: No swimming for 1 week. It is OK to let water run over the face and eyes while showering or taking a bath, even during the first week.  No other restriction on exercise or activity.  Eye movement: The eyes may look very slightly crossed in or turned out. This is not unusual postoperatively and may happen up to two months after surgery while the muscles are  healing. The eyes may be tired during the first few weeks after surgery; reading can be uncomfortable during the healing process but will not hurt the eyes.  **AVOID RUBBING THE OPERATED EYE**  Call Dr. Serita Grit office [phone] with any problems or concerns.

## 2020-05-17 NOTE — Anesthesia Postprocedure Evaluation (Signed)
Anesthesia Post Note  Patient: Susan Whitney  Procedure(s) Performed: REPAIR STRABISMUS RIGHT EYE (Right Eye)     Patient location during evaluation: PACU Anesthesia Type: General Level of consciousness: awake and alert Pain management: pain level controlled Vital Signs Assessment: post-procedure vital signs reviewed and stable Respiratory status: spontaneous breathing, nonlabored ventilation and respiratory function stable Cardiovascular status: blood pressure returned to baseline and stable Postop Assessment: no apparent nausea or vomiting Anesthetic complications: no   No complications documented.  Last Vitals:  Vitals:   05/17/20 1100 05/17/20 1115  BP: 123/81 125/80  Pulse: 79 79  Resp: 12 13  Temp:    SpO2: 92% 92%    Last Pain:  Vitals:   05/17/20 1115  TempSrc:   PainSc: [place]

## 2020-05-17 NOTE — Transfer of Care (Signed)
Immediate Anesthesia Transfer of Care Note  Patient: Susan Whitney  Procedure(s) Performed: REPAIR STRABISMUS RIGHT EYE (Right Eye)  Patient Location: PACU  Anesthesia Type:General  Level of Consciousness: drowsy  Airway & Oxygen Therapy: Patient Spontanous Breathing and Patient connected to face mask oxygen  Post-op Assessment: Report given to RN and Post -op Vital signs reviewed and stable  Post vital signs: Reviewed and stable  Last Vitals:  Vitals Value Taken Time  BP 122/76 05/17/20 1015  Temp    Pulse 78 05/17/20 1015  Resp 11 05/17/20 1015  SpO2 95 % 05/17/20 1015  Vitals shown include unvalidated device data.  Last Pain:  Vitals:   05/17/20 0804  TempSrc: Oral  PainSc: 0-No pain      Patients Stated Pain Goal: 1 (64/31/42 7670)  Complications: No complications documented.

## 2020-05-18 ENCOUNTER — Encounter (HOSPITAL_BASED_OUTPATIENT_CLINIC_OR_DEPARTMENT_OTHER): Payer: Self-pay | Admitting: Ophthalmology

## 2020-05-24 ENCOUNTER — Other Ambulatory Visit: Payer: Self-pay | Admitting: Family Medicine

## 2020-05-24 DIAGNOSIS — M7062 Trochanteric bursitis, left hip: Secondary | ICD-10-CM

## 2020-05-24 DIAGNOSIS — M7061 Trochanteric bursitis, right hip: Secondary | ICD-10-CM

## 2020-06-13 ENCOUNTER — Other Ambulatory Visit: Payer: Self-pay | Admitting: *Deleted

## 2020-06-13 MED ORDER — ALENDRONATE SODIUM 70 MG PO TABS
70.0000 mg | ORAL_TABLET | ORAL | 5 refills | Status: DC
Start: 2020-06-13 — End: 2020-11-20

## 2020-07-04 ENCOUNTER — Ambulatory Visit (INDEPENDENT_AMBULATORY_CARE_PROVIDER_SITE_OTHER): Payer: BC Managed Care – PPO

## 2020-07-04 DIAGNOSIS — Z23 Encounter for immunization: Secondary | ICD-10-CM

## 2020-07-04 NOTE — Progress Notes (Signed)
   Covid-19 Vaccination Clinic  Name:  Susan Whitney    MRN: [id number] DOB: Mar 11, 1961  07/04/2020  Susan Whitney was observed post Covid-19 immunization for 15 minutes without incident. She was provided with Vaccine Information Sheet and instruction to access the V-Safe system.   Susan Whitney was instructed to call 911 with any severe reactions post vaccine: Marland Kitchen Difficulty breathing  . Swelling of face and throat  . A fast heartbeat  . A bad rash all over body  . Dizziness and weakness   Immunizations Administered    No immunizations on file.

## 2020-10-11 ENCOUNTER — Ambulatory Visit: Payer: BC Managed Care – PPO | Admitting: Nurse Practitioner

## 2020-10-11 ENCOUNTER — Encounter: Payer: Self-pay | Admitting: Nurse Practitioner

## 2020-10-11 DIAGNOSIS — J029 Acute pharyngitis, unspecified: Secondary | ICD-10-CM | POA: Insufficient documentation

## 2020-10-11 LAB — VERITOR FLU A/B WAIVED
Influenza A: NEGATIVE
Influenza B: NEGATIVE

## 2020-10-11 MED ORDER — AZITHROMYCIN 250 MG PO TABS: ORAL_TABLET | ORAL | 0 refills | Status: DC

## 2020-10-11 MED ORDER — DM-GUAIFENESIN ER 30-600 MG PO TB12: 1.0000 | ORAL_TABLET | Freq: Two times a day (BID) | ORAL | 0 refills | Status: DC

## 2020-10-11 MED ORDER — FLUTICASONE PROPIONATE 50 MCG/ACT NA SUSP: 2.0000 | Freq: Every day | NASAL | 3 refills | Status: DC

## 2020-10-11 MED ORDER — ACETAMINOPHEN 500 MG PO TABS: 500.0000 mg | ORAL_TABLET | Freq: Four times a day (QID) | ORAL | 0 refills | Status: DC | PRN

## 2020-10-11 NOTE — Assessment & Plan Note (Signed)
Patient is being seen today for an acute upper respiratory infection/pharyngitis.  Symptoms started in the last 24 hours.  Patient is reporting increased congestion, sinus pressure, scratchy throat, head pressure, history of seasonal allergies, she denies fever and chills, no nausea or vomiting or decreased appetite.  Education provided to patient to increase hydration, Flonase ordered, guaifenesin for cough and congestion, Z-Pak. Rx sent to pharmacy  COVID-19/flu swab ordered-results pending.

## 2020-10-11 NOTE — Progress Notes (Signed)
   Virtual Visit via telephone Note Due to COVID-19 pandemic this visit was conducted virtually. This visit type was conducted due to national recommendations for restrictions regarding the COVID-19 Pandemic (e.g. social distancing, sheltering in place) in an effort to limit this patient's exposure and mitigate transmission in our community. All issues noted in this document were discussed and addressed.  A physical exam was not performed with this format.  I connected with Susan Whitney on 10/11/20 at  8:30 AM by telephone and verified that I am speaking with the correct person using two identifiers. Susan Whitney is currently located at home during visit. The provider, Ivy Lynn, NP is located in their office at time of visit.  I discussed the limitations, risks, security and privacy concerns of performing an evaluation and management service by telephone and the availability of in person appointments. I also discussed with the patient that there may be a patient responsible charge related to this service. The patient expressed understanding and agreed to proceed.   History and Present Illness:  URI  This is a new problem. The current episode started yesterday. The problem has been gradually worsening. There has been no fever. Associated symptoms include congestion, coughing, headaches and sinus pain. Pertinent negatives include no sore throat. She has tried nothing for the symptoms. The treatment provided no relief.      Review of Systems  Constitutional: Negative for chills, fever and weight loss.  HENT: Positive for congestion and sinus pain. Negative for sore throat.   Respiratory: Positive for cough.   Skin: Negative.   Neurological: Positive for headaches.     Observations/Objective: Tele visit- patient did not sound to be in distress.  Assessment and Plan: Acute pharyngitis Patient is being seen today for an acute upper respiratory infection/pharyngitis.  Symptoms started  in the last 24 hours.  Patient is reporting increased congestion, sinus pressure, scratchy throat, head pressure, history of seasonal allergies, she denies fever and chills, no nausea or vomiting or decreased appetite.  Education provided to patient to increase hydration, Flonase ordered, guaifenesin for cough and congestion, Z-Pak. Rx sent to pharmacy  COVID-19/flu swab ordered-results pending.   Follow Up Instructions: Follow-up as needed    I discussed the assessment and treatment plan with the patient. The patient was provided an opportunity to ask questions and all were answered. The patient agreed with the plan and demonstrated an understanding of the instructions.   The patient was advised to call back or seek an in-person evaluation if the symptoms worsen or if the condition fails to improve as anticipated.  The above assessment and management plan was discussed with the patient. The patient verbalized understanding of and has agreed to the management plan. Patient is aware to call the clinic if symptoms persist or worsen. Patient is aware when to return to the clinic for a follow-up visit. Patient educated on when it is appropriate to go to the emergency department.   Time call ended: 8:39 AM  I provided 9 minutes of non-face-to-face time during this encounter.    Ivy Lynn, NP

## 2020-10-11 NOTE — Patient Instructions (Signed)

## 2020-10-12 LAB — SARS-COV-2, NAA 2 DAY TAT

## 2020-10-12 LAB — NOVEL CORONAVIRUS, NAA: SARS-CoV-2, NAA: NOT DETECTED

## 2020-11-20 ENCOUNTER — Ambulatory Visit (INDEPENDENT_AMBULATORY_CARE_PROVIDER_SITE_OTHER): Payer: BC Managed Care – PPO | Admitting: Family Medicine

## 2020-11-20 ENCOUNTER — Other Ambulatory Visit: Payer: Self-pay

## 2020-11-20 ENCOUNTER — Encounter: Payer: Self-pay | Admitting: Family Medicine

## 2020-11-20 VITALS — BP 105/69 | HR 62 | Temp 97.7°F | Ht 65.0 in | Wt 197.6 lb

## 2020-11-20 DIAGNOSIS — E559 Vitamin D deficiency, unspecified: Secondary | ICD-10-CM | POA: Diagnosis not present

## 2020-11-20 DIAGNOSIS — R2241 Localized swelling, mass and lump, right lower limb: Secondary | ICD-10-CM

## 2020-11-20 DIAGNOSIS — Z0001 Encounter for general adult medical examination with abnormal findings: Secondary | ICD-10-CM | POA: Diagnosis not present

## 2020-11-20 DIAGNOSIS — E611 Iron deficiency: Secondary | ICD-10-CM | POA: Diagnosis not present

## 2020-11-20 DIAGNOSIS — M810 Age-related osteoporosis without current pathological fracture: Secondary | ICD-10-CM | POA: Diagnosis not present

## 2020-11-20 DIAGNOSIS — M7061 Trochanteric bursitis, right hip: Secondary | ICD-10-CM

## 2020-11-20 DIAGNOSIS — E7800 Pure hypercholesterolemia, unspecified: Secondary | ICD-10-CM

## 2020-11-20 DIAGNOSIS — M7062 Trochanteric bursitis, left hip: Secondary | ICD-10-CM

## 2020-11-20 DIAGNOSIS — Z0000 Encounter for general adult medical examination without abnormal findings: Secondary | ICD-10-CM

## 2020-11-20 DIAGNOSIS — J301 Allergic rhinitis due to pollen: Secondary | ICD-10-CM

## 2020-11-20 MED ORDER — IBANDRONATE SODIUM 150 MG PO TABS: 150.0000 mg | ORAL_TABLET | ORAL | 3 refills | Status: DC

## 2020-11-20 MED ORDER — MELOXICAM 7.5 MG PO TABS: 7.5000 mg | ORAL_TABLET | Freq: Every day | ORAL | 1 refills | Status: DC | PRN

## 2020-11-20 MED ORDER — CETIRIZINE HCL 10 MG PO TABS: 10.0000 mg | ORAL_TABLET | Freq: Every day | ORAL | 3 refills | Status: DC

## 2020-11-20 NOTE — Patient Instructions (Signed)
You had labs performed today.  You will be contacted with the results of the labs once they are available, usually in the next 3 business days for routine lab work.  If you have an active my chart account, they will be released to your MyChart.  If you prefer to have these labs released to you via telephone, please let us know.  If you had a pap smear or biopsy performed, expect to be contacted in about 7-10 days.   Lipoma  A lipoma is a noncancerous (benign) tumor that is made up of fat cells. This is a very common type of soft-tissue growth. Lipomas are usually found under the skin (subcutaneous). They may occur in any tissue of the body that contains fat. Common areas for lipomas to appear include the back, arms, shoulders, buttocks, and thighs. Lipomas grow slowly, and they are usually painless. Most lipomas do not cause problems and do not require treatment. What are the causes? The cause of this condition is not known. What increases the risk? You are more likely to develop this condition if:  You are 13-63 years old.  You have a family history of lipomas. What are the signs or symptoms? A lipoma usually appears as a small, round bump under the skin. In most cases, the lump will:  Feel soft or rubbery.  Not cause pain or other symptoms. However, if a lipoma is located in an area where it pushes on nerves, it can become painful or cause other symptoms. How is this diagnosed? A lipoma can usually be diagnosed with a physical exam. You may also have tests to confirm the diagnosis and to rule out other conditions. Tests may include:  Imaging tests, such as a CT scan or an MRI.  Removal of a tissue sample to be looked at under a microscope (biopsy). How is this treated? Treatment for this condition depends on the size of the lipoma and whether it is causing any symptoms.  For small lipomas that are not causing problems, no treatment is needed.  If a lipoma is bigger or it causes  problems, surgery may be done to remove the lipoma. Lipomas can also be removed to improve appearance. Most often, the procedure is done after applying a medicine that numbs the area (local anesthetic).  Liposuction may be done to reduce the size of the lipoma before it is removed through surgery, or it may be done to remove the lipoma. Lipomas are removed with this method in order to limit incision size and scarring. A liposuction tube is inserted through a small incision into the lipoma, and the contents of the lipoma are removed through the tube with suction. Follow these instructions at home:  Watch your lipoma for any changes.  Keep all follow-up visits as told by your health care provider. This is important. Contact a health care provider if:  Your lipoma becomes larger or hard.  Your lipoma becomes painful, red, or increasingly swollen. These could be signs of infection or a more serious condition. Get help right away if:  You develop tingling or numbness in an area near the lipoma. This could indicate that the lipoma is causing nerve damage. Summary  A lipoma is a noncancerous tumor that is made up of fat cells.  Most lipomas do not cause problems and do not require treatment.  If a lipoma is bigger or it causes problems, surgery may be done to remove the lipoma.  Contact a health care provider if your  lipoma becomes larger or hard, or if it becomes painful, red, or increasingly swollen. Pain, redness, and swelling could be signs of infection or a more serious condition. This information is not intended to replace advice given to you by your health care provider. Make sure you discuss any questions you have with your health care provider. Document Revised: 02/21/2019 Document Reviewed: 02/21/2019 Elsevier Patient Education  2021 [employer] Inc[employer]  Preventive Care 23-61 Years Old, Female Preventive care refers to lifestyle choices and visits with your health care provider that can  promote health and wellness. This includes:  A yearly physical exam. This is also called an annual wellness visit.  Regular dental and eye exams.  Immunizations.  Screening for certain conditions.  Healthy lifestyle choices, such as: ? Eating a healthy diet. ? Getting regular exercise. ? Not using drugs or products that contain nicotine and tobacco. ? Limiting alcohol use. What can I expect for my preventive care visit? Physical exam Your health care provider will check your:  Height and weight. These may be used to calculate your BMI (body mass index). BMI is a measurement that tells if you are at a healthy weight.  Heart rate and blood pressure.  Body temperature.  Skin for abnormal spots. Counseling Your health care provider may ask you questions about your:  Past medical problems.  Family's medical history.  Alcohol, tobacco, and drug use.  Emotional well-being.  Home life and relationship well-being.  Sexual activity.  Diet, exercise, and sleep habits.  Work and work Statistician.  Access to firearms.  Method of birth control.  Menstrual cycle.  Pregnancy history. What immunizations do I need? Vaccines are usually given at various ages, according to a schedule. Your health care provider will recommend vaccines for you based on your age, medical history, and lifestyle or other factors, such as travel or where you work.   What tests do I need? Blood tests  Lipid and cholesterol levels. These may be checked every 5 years, or more often if you are over 44 years old.  Hepatitis C test.  Hepatitis B test. Screening  Lung cancer screening. You may have this screening every year starting at age 50 if you have a 30-pack-year history of smoking and currently smoke or have quit within the past 15 years.  Colorectal cancer screening. ? All adults should have this screening starting at age 7 and continuing until age 68. ? Your health care provider may  recommend screening at age 53 if you are at increased risk. ? You will have tests every 1-10 years, depending on your results and the type of screening test.  Diabetes screening. ? This is done by checking your blood sugar (glucose) after you have not eaten for a while (fasting). ? You may have this done every 1-3 years.  Mammogram. ? This may be done every 1-2 years. ? Talk with your health care provider about when you should start having regular mammograms. This may depend on whether you have a family history of breast cancer.  BRCA-related cancer screening. This may be done if you have a family history of breast, ovarian, tubal, or peritoneal cancers.  Pelvic exam and Pap test. ? This may be done every 3 years starting at age 76. ? Starting at age 65, this may be done every 5 years if you have a Pap test in combination with an HPV test. Other tests  STD (sexually transmitted disease) testing, if you are at risk.  Bone density scan.  This is done to screen for osteoporosis. You may have this scan if you are at high risk for osteoporosis. Talk with your health care provider about your test results, treatment options, and if necessary, the need for more tests. Follow these instructions at home: Eating and drinking  Eat a diet that includes fresh fruits and vegetables, whole grains, lean protein, and low-fat dairy products.  Take vitamin and mineral supplements as recommended by your health care provider.  Do not drink alcohol if: ? Your health care provider tells you not to drink. ? You are pregnant, may be pregnant, or are planning to become pregnant.  If you drink alcohol: ? Limit how much you have to 0-1 drink a day. ? Be aware of how much alcohol is in your drink. In the [place] one drink equals one 12 oz bottle of beer (355 mL), one 5 oz glass of wine (148 mL), or one 1 oz glass of hard liquor (44 mL).   Lifestyle  Take daily care of your teeth and gums. Brush your teeth  every morning and night with fluoride toothpaste. Floss one time each day.  Stay active. Exercise for at least 30 minutes 5 or more days each week.  Do not use any products that contain nicotine or tobacco, such as cigarettes, e-cigarettes, and chewing tobacco. If you need help quitting, ask your health care provider.  Do not use drugs.  If you are sexually active, practice safe sex. Use a condom or other form of protection to prevent STIs (sexually transmitted infections).  If you do not wish to become pregnant, use a form of birth control. If you plan to become pregnant, see your health care provider for a prepregnancy visit.  If told by your health care provider, take low-dose aspirin daily starting at age 54.  Find healthy ways to cope with stress, such as: ? Meditation, yoga, or listening to music. ? Journaling. ? Talking to a trusted person. ? Spending time with friends and family. Safety  Always wear your seat belt while driving or riding in a vehicle.  Do not drive: ? If you have been drinking alcohol. Do not ride with someone who has been drinking. ? When you are tired or distracted. ? While texting.  Wear a helmet and other protective equipment during sports activities.  If you have firearms in your house, make sure you follow all gun safety procedures. What's next?  Visit your health care provider once a year for an annual wellness visit.  Ask your health care provider how often you should have your eyes and teeth checked.  Stay up to date on all vaccines. This information is not intended to replace advice given to you by your health care provider. Make sure you discuss any questions you have with your health care provider. Document Revised: 04/10/2020 Document Reviewed: 03/18/2018 Elsevier Patient Education  2021 [employer].

## 2020-11-20 NOTE — Progress Notes (Signed)
Susan Whitney is a 60 y.o. female presents to office today for annual physical exam examination.    Concerns today include: 1.  Right thigh mass Patient reports that she has had a soft tissue mass on the right anterior thigh for years but as of late it seems to be getting bigger.  She denies any pain, discoloration.  2.  Osteoporosis Patient is treated with Fosamax.  She does that she often cannot remember to take it weekly but would be willing to try a once monthly.  No reports of bone pain.  3.  Bursitis Patient reports she continues to have bursitis in the hips.  However, meloxicam does seem to help when she has these flares.  She needs a renewal on this  Occupation: Pharmacist, hospital, Marital status: married, Substance use: none Diet: high in fruits, working on water consumption, Exercise: no reports of structured Last eye exam: UTD, recent surgical correction of exotropia Last dental exam: UTD Last colonoscopy: UTD Last mammogram: UTD with Dr Rogue Bussing Last pap smear: UTD with Dr Rogue Bussing Refills needed today: Mobic, zyrtec Immunizations needed:  Immunization History  Administered Date(s) Administered  . Influenza Split 05/03/2013  . Influenza,inj,Quad PF,6+ Mos 04/23/2015, 04/18/2016, 05/01/2017, 04/14/2018, 05/11/2019  . Influenza,inj,Quad PF,6-35 Mos 05/01/2014  . Influenza-Unspecified 04/21/2015, 05/21/2016  . Moderna SARS-COV2 Booster Vaccination 07/04/2020  . Moderna Sars-Covid-2 Vaccination 08/15/2019, 09/18/2019     Past Medical History:  Diagnosis Date  . Anxiety   . Osteopenia   . RLS (restless legs syndrome)    Social History   Socioeconomic History  . Marital status: Married    Spouse name: Not on file  . Number of children: Not on file  . Years of education: Not on file  . Highest education level: Not on file  Occupational History  . Not on file  Tobacco Use  . Smoking status: Never Smoker  . Smokeless tobacco: Never  Used  Vaping Use  . Vaping Use: Never used  Substance and Sexual Activity  . Alcohol use: No  . Drug use: No  . Sexual activity: Not on file  Other Topics Concern  . Not on file  Social History Narrative  . Not on file   Social Determinants of Health   Financial Resource Strain: Not on file  Food Insecurity: Not on file  Transportation Needs: Not on file  Physical Activity: Not on file  Stress: Not on file  Social Connections: Not on file  Intimate Partner Violence: Not on file   Past Surgical History:  Procedure Laterality Date  . ABDOMINAL HYSTERECTOMY    . CHOLECYSTECTOMY    . FOOT NEUROMA SURGERY Right 09/2014  . SINUS IRRIGATION  2016  . STRABISMUS SURGERY Right 05/17/2020   Procedure: REPAIR STRABISMUS RIGHT EYE;  Surgeon: Lamonte Sakai, MD;  Location: [facility];  Service: Ophthalmology;  Laterality: Right;  . TUBAL LIGATION     Family History  Problem Relation Age of Onset  . Emphysema Mother        smoked  . Hip fracture Mother   . Colon cancer Father   . Hypertension Sister   . Hypertension Brother     Current Outpatient Medications:  .  acetaminophen (TYLENOL) 500 MG tablet, Take 1 tablet (500 mg total) by mouth every 6 (six) hours as needed., Disp: 30 tablet, Rfl: 0 .  alendronate (FOSAMAX) 70 MG tablet, Take 1 tablet (70 mg total) by mouth every 7 (seven) days.  Take with a full glass of water on an empty stomach., Disp: 4 tablet, Rfl: 5 .  azelastine (ASTELIN) 0.1 % nasal spray, Place 1 spray into both nostrils 2 (two) times daily., Disp: 30 mL, Rfl: 12 .  azithromycin (ZITHROMAX) 250 MG tablet, 2 tablets [500 mg] day 1, 1 tablet [250 mg] day 2-5., Disp: 6 tablet, Rfl: 0 .  Black Cohosh 160 MG CAPS, black cohosh, Disp: , Rfl:  .  cetirizine (ZYRTEC) 10 MG tablet, Take 1 tablet (10 mg total) by mouth daily., Disp: 30 tablet, Rfl: 11 .  dextromethorphan-guaiFENesin (MUCINEX DM) 30-600 MG 12hr tablet, Take 1 tablet by mouth 2 (two) times  daily., Disp: 30 tablet, Rfl: 0 .  fluticasone (FLONASE) 50 MCG/ACT nasal spray, Place 2 sprays into both nostrils daily., Disp: 16 g, Rfl: 3 .  meloxicam (MOBIC) 7.5 MG tablet, TAKE 1-2 TABLETS (7.5-15 MG TOTAL) BY MOUTH DAILY AS NEEDED FOR PAIN., Disp: 60 tablet, Rfl: 2 .  neomycin-polymyxin-dexameth (MAXITROL) 0.1 % OINT, Place 1 application into the right eye 4 (four) times daily. For one week, Disp: , Rfl:  .  sertraline (ZOLOFT) 50 MG tablet, Take 1 tablet (50 mg total) by mouth daily., Disp: 90 tablet, Rfl: 0  No Known Allergies   ROS: Review of Systems Pertinent items noted in HPI and remainder of comprehensive ROS otherwise negative.    Physical exam BP 105/69   Pulse 62   Temp 97.7 F (36.5 C)   Ht _0  (1.651 m)   Wt 197 lb 9.6 oz (89.6 kg)   SpO2 98%   BMI 32.88 kg/m  General appearance: alert, cooperative, appears stated age and no distress Head: Normocephalic, without obvious abnormality, atraumatic Eyes: negative findings: lids and lashes normal, conjunctivae and sclerae normal, corneas clear and pupils equal, round, reactive to light and accomodation Ears: normal TM's and external ear canals both ears Nose: Nares normal. Septum midline. Mucosa normal. No drainage or sinus tenderness. Throat: lips, mucosa, and tongue normal; teeth and gums normal Neck: no adenopathy, no carotid bruit, supple, symmetrical, trachea midline and thyroid not enlarged, symmetric, no tenderness/mass/nodules Back: symmetric, no curvature. ROM normal. No CVA tenderness. Lungs: clear to auscultation bilaterally Heart: regular rate and rhythm, S1, S2 normal, no murmur, click, rub or gallop Abdomen: soft, non-tender; bowel sounds normal; no masses,  no organomegaly Extremities: extremities normal, atraumatic, no cyanosis or edema Pulses: 2+ and symmetric Skin: Skin color, texture, turgor normal. No rashes or lesions Lymph nodes: Cervical, supraclavicular, and axillary nodes  normal. Neurologic: Alert and oriented X 3, normal strength and tone. Normal symmetric reflexes. Normal coordination and gait PSych: Mood stable, speech normal Right thigh: Baseball sized soft tissue mass is well-circumscribed noted along the anterior upper right thigh.  This is nontender.  It is rubbery in consistency suggestive of lipoma   Assessment/ Plan: Susan Whitney here for annual physical exam.   Annual physical exam  Mass of right thigh - Plan: [place] RT LOWER EXTREM LTD SOFT TISSUE NON VASCULAR  Age-related osteoporosis without current pathological fracture - Plan: CMP14+EGFR, VITAMIN D 25 Hydroxy (Vit-D Deficiency, Fractures), ibandronate (BONIVA) 150 MG tablet, VITAMIN D 25 Hydroxy (Vit-D Deficiency, Fractures), CMP14+EGFR  Vitamin D deficiency - Plan: VITAMIN D 25 Hydroxy (Vit-D Deficiency, Fractures), VITAMIN D 25 Hydroxy (Vit-D Deficiency, Fractures)  Pure hypercholesterolemia - Plan: Lipid panel, TSH, TSH, Lipid panel  Trochanteric bursitis of both hips - Plan: meloxicam (MOBIC) 7.5 MG tablet  Iron deficiency - Plan: CBC with Differential/Platelet,  CBC with Differential/Platelet, Ferritin  Allergic rhinitis due to pollen, unspecified seasonality - Plan: cetirizine (ZYRTEC) 10 MG tablet  I suspect this is a lipoma in the right thigh but given that it is getting larger will evaluate further with ultrasound.  Discussed possibility of need of an MRI if unable to diagnose with certainty.  Order has been placed.  Switch over to Boniva.  Hopefully this will improve compliance with her medication.  Check vitamin D level, CMP  Check fasting lipid, TSH  Meloxicam renewed for as needed use for bursitis  Check CBC, ferritin for iron deficiency  Zyrtec renewed for allergic rhinitis  Counseled on healthy lifestyle choices, including diet (rich in fruits, vegetables and lean meats and low in salt and simple carbohydrates) and exercise (at least 30 minutes of moderate physical  activity daily).  Patient to follow up in 1 year for annual exam or sooner if needed.  Susan Whitney M. Lajuana Ripple, DO

## 2020-11-21 LAB — CBC WITH DIFFERENTIAL/PLATELET
Basophils Absolute: 0.1 10*3/uL (ref 0.0–0.2)
Basos: 1 %
EOS (ABSOLUTE): 0.6 10*3/uL — ABNORMAL HIGH (ref 0.0–0.4)
Eos: 6 %
Hematocrit: 42.1 % (ref 34.0–46.6)
Hemoglobin: 14 g/dL (ref 11.1–15.9)
Immature Grans (Abs): 0 10*3/uL (ref 0.0–0.1)
Immature Granulocytes: 0 %
Lymphocytes Absolute: 2.1 10*3/uL (ref 0.7–3.1)
Lymphs: 23 %
MCH: 27 pg (ref 26.6–33.0)
MCHC: 33.3 g/dL (ref 31.5–35.7)
MCV: 81 fL (ref 79–97)
Monocytes Absolute: 0.5 10*3/uL (ref 0.1–0.9)
Monocytes: 6 %
Neutrophils Absolute: 5.8 10*3/uL (ref 1.4–7.0)
Neutrophils: 64 %
Platelets: 355 10*3/uL (ref 150–450)
RBC: 5.19 x10E6/uL (ref 3.77–5.28)
RDW: 14.7 % (ref 11.7–15.4)
WBC: 9.1 10*3/uL (ref 3.4–10.8)

## 2020-11-21 LAB — CMP14+EGFR
ALT: 6 IU/L (ref 0–32)
AST: 9 IU/L (ref 0–40)
Albumin/Globulin Ratio: 1.8 (ref 1.2–2.2)
Albumin: 4.7 g/dL (ref 3.8–4.9)
Alkaline Phosphatase: 91 IU/L (ref 44–121)
BUN/Creatinine Ratio: 18 (ref 9–23)
BUN: 12 mg/dL (ref 6–24)
Bilirubin Total: 0.3 mg/dL (ref 0.0–1.2)
CO2: 21 mmol/L (ref 20–29)
Calcium: 9.2 mg/dL (ref 8.7–10.2)
Chloride: 101 mmol/L (ref 96–106)
Creatinine, Ser: 0.67 mg/dL (ref 0.57–1.00)
Globulin, Total: 2.6 g/dL (ref 1.5–4.5)
Glucose: 97 mg/dL (ref 65–99)
Potassium: 4.4 mmol/L (ref 3.5–5.2)
Sodium: 137 mmol/L (ref 134–144)
Total Protein: 7.3 g/dL (ref 6.0–8.5)
eGFR: 101 mL/min/{1.73_m2} (ref >59)

## 2020-11-21 LAB — VITAMIN D 25 HYDROXY (VIT D DEFICIENCY, FRACTURES): Vit D, 25-Hydroxy: 28.5 ng/mL — ABNORMAL LOW (ref 30.0–100.0)

## 2020-11-21 LAB — LIPID PANEL
Chol/HDL Ratio: 3.1 ratio (ref 0.0–4.4)
Cholesterol, Total: 156 mg/dL (ref 100–199)
HDL: 50 mg/dL (ref >39)
LDL Chol Calc (NIH): 90 mg/dL (ref 0–99)
Triglycerides: 86 mg/dL (ref 0–149)
VLDL Cholesterol Cal: 16 mg/dL (ref 5–40)

## 2020-11-21 LAB — TSH: TSH: 1.54 u[IU]/mL (ref 0.450–4.500)

## 2020-11-21 LAB — FERRITIN: Ferritin: 50 ng/mL (ref 15–150)

## 2020-11-23 ENCOUNTER — Other Ambulatory Visit: Payer: Self-pay

## 2020-11-23 ENCOUNTER — Ambulatory Visit (HOSPITAL_COMMUNITY)
Admission: RE | Admit: 2020-11-23 | Discharge: 2020-11-23 | Disposition: A | Discharge status: Home / Self Care | Payer: BC Managed Care – PPO | Source: Ambulatory Visit | Attending: Family Medicine | Admitting: Family Medicine

## 2020-11-23 DIAGNOSIS — R2241 Localized swelling, mass and lump, right lower limb: Secondary | ICD-10-CM | POA: Insufficient documentation

## 2020-11-26 ENCOUNTER — Other Ambulatory Visit: Payer: Self-pay | Admitting: Family Medicine

## 2020-11-26 DIAGNOSIS — R2241 Localized swelling, mass and lump, right lower limb: Secondary | ICD-10-CM

## 2020-12-27 ENCOUNTER — Ambulatory Visit: Payer: BC Managed Care – PPO | Admitting: Family

## 2020-12-27 ENCOUNTER — Encounter: Payer: Self-pay | Admitting: Family

## 2020-12-27 ENCOUNTER — Other Ambulatory Visit: Payer: Self-pay

## 2020-12-27 VITALS — BP 122/82 | HR 82 | Temp 97.7°F | Ht 65.0 in | Wt 192.0 lb

## 2020-12-27 DIAGNOSIS — J209 Acute bronchitis, unspecified: Secondary | ICD-10-CM

## 2020-12-27 DIAGNOSIS — R059 Cough, unspecified: Secondary | ICD-10-CM

## 2020-12-27 MED ORDER — PREDNISONE 10 MG (21) PO TBPK: ORAL_TABLET | ORAL | 0 refills | Status: DC

## 2020-12-27 NOTE — Progress Notes (Signed)
Subjective:    Patient ID: Susan Whitney, female    DOB: 01/26/1961, 60 y.o.   MRN: [id number]  Chief Complaint  Patient presents with   Sinus Problem    Drainage, head pressure, started Tuesday     Sinus Problem This is a new problem. The current episode started in the past 7 days. The problem has been gradually worsening since onset. There has been no fever. Her pain is at a severity of 5/10. The pain is moderate. Associated symptoms include congestion, coughing, headaches, sinus pressure, sneezing and a sore throat. Pertinent negatives include no ear pain or shortness of breath. Past treatments include acetaminophen. The treatment provided mild relief.     Review of Systems  HENT:  Positive for congestion, sinus pressure, sneezing and sore throat. Negative for ear pain.   Respiratory:  Positive for cough. Negative for shortness of breath.   Neurological:  Positive for headaches.  All other systems reviewed and are negative.     Objective:   Physical Exam Vitals reviewed.  Constitutional:      General: She is not in acute distress.    Appearance: She is well-developed.  HENT:     Head: Normocephalic and atraumatic.     Right Ear: Tympanic membrane normal.     Left Ear: Tympanic membrane normal.     Nose: Congestion and rhinorrhea present.  Eyes:     Pupils: Pupils are equal, round, and reactive to light.  Neck:     Thyroid: No thyromegaly.  Cardiovascular:     Rate and Rhythm: Normal rate and regular rhythm.     Heart sounds: Normal heart sounds. No murmur heard. Pulmonary:     Effort: Pulmonary effort is normal. No respiratory distress.     Breath sounds: Normal breath sounds. No wheezing.  Abdominal:     General: Bowel sounds are normal. There is no distension.     Palpations: Abdomen is soft.     Tenderness: There is no abdominal tenderness.  Musculoskeletal:        General: No tenderness. Normal range of motion.     Cervical back: Normal range of motion and neck  supple.  Skin:    General: Skin is warm and dry.  Neurological:     Mental Status: She is alert and oriented to person, place, and time.     Cranial Nerves: No cranial nerve deficit.     Deep Tendon Reflexes: Reflexes are normal and symmetric.  Psychiatric:        Behavior: Behavior normal.        Thought Content: Thought content normal.        Judgment: Judgment normal.      BP 122/82   Pulse 82   Temp 97.7 F (36.5 C) (Temporal)   Ht 5\' 5"  (1.651 m)   Wt 192 lb (87.1 kg)   SpO2 97%   BMI 31.95 kg/m      Assessment & Plan:  Susan Whitney comes in today with chief complaint of Sinus Problem (Drainage, head pressure, started Tuesday )   Diagnosis and orders addressed:  1. Cough - Novel Coronavirus, NAA ([facility])  2. Acute bronchitis, unspecified organism - Take meds as prescribed - Use a cool mist humidifier  -Use saline nose sprays frequently -Force fluids -For any cough or congestion  Use plain Mucinex- regular strength or max strength is fine -For fever or aces or pains- take tylenol or ibuprofen. -Throat lozenges if help -RTO if symptoms  worsen or do not improve  - predniSONE (STERAPRED UNI-PAK 21 TAB) 10 MG (21) TBPK tablet; Use as directed  Dispense: 21 tablet; Refill: 0   Susan Dun, FNP

## 2020-12-27 NOTE — Patient Instructions (Signed)
Acute Bronchitis, Adult  Acute bronchitis is sudden or acute swelling of the air tubes (bronchi) in the lungs. Acute bronchitis causes these tubes to fill with mucus, which can make it hard to breathe. It can also cause coughing or wheezing. In adults, acute bronchitis usually goes away within 2 weeks. A cough caused by bronchitis may last up to 3 weeks. Smoking, allergies, and asthma can make the condition worse. What are the causes? This condition can be caused by germs and by substances that irritate the lungs, including:  Cold and flu viruses. The most common cause of this condition is the virus that causes the common cold.  Bacteria.  Substances that irritate the lungs, including: ? Smoke from cigarettes and other forms of tobacco. ? Dust and pollen. ? Fumes from chemical products, gases, or burned fuel. ? Other materials that pollute indoor or outdoor air.  Close contact with someone who has acute bronchitis. What increases the risk? The following factors may make you more likely to develop this condition:  A weak body's defense system, also called the immune system.  A condition that affects your lungs and breathing, such as asthma. What are the signs or symptoms? Common symptoms of this condition include:  Lung and breathing problems, such as: ? Coughing. This may bring up clear, yellow, or green mucus from your lungs (sputum). ? Wheezing. ? Having too much mucus in your lungs (chest congestion). ? Having shortness of breath.  A fever.  Chills.  Aches and pains, including: ? Tightness in your chest and other body aches. ? A sore throat. How is this diagnosed? This condition is usually diagnosed based on:  Your symptoms and medical history.  A physical exam. You may also have other tests, including tests to rule out other conditions, such pneumonia. These tests include:  A test of lung function.  Test of a mucus sample to look for the presence of  bacteria.  Tests to check the oxygen level in your blood.  Blood tests.  Chest X-ray. How is this treated? Most cases of acute bronchitis clear up over time without treatment. Your health care provider may recommend:  Drinking more fluids. This can thin your mucus, which may improve your breathing.  Taking a medicine for a fever or cough.  Using a device that gets medicine into your lungs (inhaler) to help improve breathing and control coughing.  Using a vaporizer or a humidifier. These are machines that add water to the air to help you breathe better. Follow these instructions at home: Activity  Get plenty of rest.  Return to your normal activities as told by your health care provider. Ask your health care provider what activities are safe for you. Lifestyle  Drink enough fluid to keep your urine pale yellow.  Do not drink alcohol.  Do not use any products that contain nicotine or tobacco, such as cigarettes, e-cigarettes, and chewing tobacco. If you need help quitting, ask your health care provider. Be aware that: ? Your bronchitis will get worse if you smoke or breathe in other people's smoke (secondhand smoke). ? Your lungs will heal faster if you quit smoking. General instructions  Take over-the-counter and prescription medicines only as told by your health care provider.  Use an inhaler, vaporizer, or humidifier as told by your health care provider.  If you have a sore throat, gargle with a salt-water mixture 3-4 times a day or as needed. To make a salt-water mixture, completely dissolve -1 tsp (3-6 g)   of salt in 1 cup (237 mL) of warm water.  Keep all follow-up visits as told by your health care provider. This is important.   How is this prevented? To lower your risk of getting this condition again:  Wash your hands often with soap and water. If soap and water are not available, use hand sanitizer.  Avoid contact with people who have cold symptoms.  Try not to  touch your mouth, nose, or eyes with your hands.  Avoid places where there are fumes from chemicals. Breathing these fumes will make your condition worse.  Get the flu shot every year.   Contact a health care provider if:  Your symptoms do not improve after 2 weeks of treatment.  You vomit more than once or twice.  You have symptoms of dehydration such as: ? Dark urine. ? Dry skin or eyes. ? Increased thirst. ? Headaches. ? Confusion. ? Muscle cramps. Get help right away if you:  Cough up blood.  Feel pain in your chest.  Have severe shortness of breath.  Faint or keep feeling like you are going to faint.  Have a severe headache.  Have fever or chills that get worse. These symptoms may represent a serious problem that is an emergency. Do not wait to see if the symptoms will go away. Get medical help right away. Call your local emergency services (911 in the [place] Do not drive yourself to the hospital. Summary  Acute bronchitis is sudden (acute) inflammation of the air tubes (bronchi) between the windpipe and the lungs. In adults, acute bronchitis usually goes away within 2 weeks, although coughing may last 3 weeks or longer  Take over-the-counter and prescription medicines only as told by your health care provider.  Drink enough fluid to keep your urine pale yellow.  Contact a health care provider if your symptoms do not improve after 2 weeks of treatment.  Get help right away if you cough up blood, faint, or have chest pain or shortness of breath. This information is not intended to replace advice given to you by your health care provider. Make sure you discuss any questions you have with your health care provider. Document Revised: 03/21/2019 Document Reviewed: 01/28/2019 Elsevier Patient Education  2021 [employer] Inc.  

## 2020-12-28 LAB — NOVEL CORONAVIRUS, NAA: SARS-CoV-2, NAA: NOT DETECTED

## 2020-12-28 LAB — SARS-COV-2, NAA 2 DAY TAT

## 2021-01-07 ENCOUNTER — Telehealth: Payer: Self-pay | Admitting: *Deleted

## 2021-01-07 NOTE — Telephone Encounter (Signed)
Fax from [facility] for Fluticasone Not on current med list. Last OV 12/27/20 Next OV not sched

## 2021-01-08 ENCOUNTER — Other Ambulatory Visit: Payer: Self-pay | Admitting: Family Medicine

## 2021-01-08 MED ORDER — FLUTICASONE PROPIONATE 50 MCG/ACT NA SUSP
2.0000 | Freq: Every day | NASAL | 6 refills | Status: DC
Start: 2021-01-08 — End: 2021-05-14

## 2021-01-17 ENCOUNTER — Other Ambulatory Visit: Payer: Self-pay | Admitting: Family Medicine

## 2021-03-26 ENCOUNTER — Ambulatory Visit: Payer: BC Managed Care – PPO | Admitting: Nurse Practitioner

## 2021-03-26 ENCOUNTER — Encounter: Payer: Self-pay | Admitting: Nurse Practitioner

## 2021-03-26 DIAGNOSIS — R197 Diarrhea, unspecified: Secondary | ICD-10-CM | POA: Diagnosis not present

## 2021-03-26 MED ORDER — SACCHAROMYCES BOULARDII 250 MG PO CAPS: 250.0000 mg | ORAL_CAPSULE | Freq: Two times a day (BID) | ORAL | 0 refills | Status: DC

## 2021-03-26 MED ORDER — LOPERAMIDE HCL 2 MG PO TABS: 4.0000 mg | ORAL_TABLET | Freq: Four times a day (QID) | ORAL | 0 refills | Status: DC | PRN

## 2021-03-26 MED ORDER — ONDANSETRON HCL 4 MG PO TABS: 4.0000 mg | ORAL_TABLET | Freq: Three times a day (TID) | ORAL | 0 refills | Status: DC | PRN

## 2021-03-26 NOTE — Assessment & Plan Note (Signed)
Imodium 4 mg tablet by mouth 3 times daily as needed, Zofran for nausea, Florastor, increase hydration and electrolyte, BRAT diet. Stool cultures completed results pen

## 2021-03-26 NOTE — Progress Notes (Signed)
   Virtual Visit  Note Due to COVID-19 pandemic this visit was conducted virtually. This visit type was conducted due to national recommendations for restrictions regarding the COVID-19 Pandemic (e.g. social distancing, sheltering in place) in an effort to limit this patient's exposure and mitigate transmission in our community. All issues noted in this document were discussed and addressed.  A physical exam was not performed with this format.  I connected with Susan Whitney on 03/26/21 at 9:13 AM by telephone and verified that I am speaking with the correct person using two identifiers. Susan Whitney is currently located at home during visit. The provider, Ivy Lynn, NP is located in their office at time of visit.  I discussed the limitations, risks, security and privacy concerns of performing an evaluation and management service by telephone and the availability of in person appointments. I also discussed with the patient that there may be a patient responsible charge related to this service. The patient expressed understanding and agreed to proceed.   History and Present Illness:  Diarrhea  This is a new problem. Episode onset: 2-3 days. The problem has been unchanged. The stool consistency is described as Watery. Associated symptoms include abdominal pain. Pertinent negatives include no chills or coughing. Nothing aggravates the symptoms. There are no known risk factors. She has tried bismuth subsalicylate for the symptoms. The treatment provided no relief. There is no history of bowel resection, inflammatory bowel disease or irritable bowel syndrome.     Review of Systems  Constitutional:  Negative for chills.  Respiratory:  Negative for cough.   Gastrointestinal:  Positive for abdominal pain and diarrhea.    Observations/Objective: Televisit patient not in distress.  Assessment and Plan: Imodium 4 mg tablet by mouth 3 times daily as needed, Zofran for nausea, Florastor, increase  hydration and electrolyte, BRAT diet. Stool cultures completed results pending.  Follow Up Instructions: Follow-up with worsening unresolved symptoms.    I discussed the assessment and treatment plan with the patient. The patient was provided an opportunity to ask questions and all were answered. The patient agreed with the plan and demonstrated an understanding of the instructions.   The patient was advised to call back or seek an in-person evaluation if the symptoms worsen or if the condition fails to improve as anticipated.  The above assessment and management plan was discussed with the patient. The patient verbalized understanding of and has agreed to the management plan. Patient is aware to call the clinic if symptoms persist or worsen. Patient is aware when to return to the clinic for a follow-up visit. Patient educated on when it is appropriate to go to the emergency department.   Time call ended: 9:20 AM  I provided 7 minutes of  non face-to-face time during this encounter.    Ivy Lynn, NP

## 2021-04-04 ENCOUNTER — Ambulatory Visit: Payer: BC Managed Care – PPO | Admitting: Pharmacist

## 2021-04-21 ENCOUNTER — Other Ambulatory Visit: Payer: Self-pay | Admitting: Family Medicine

## 2021-05-14 ENCOUNTER — Ambulatory Visit: Payer: BC Managed Care – PPO | Admitting: Family Medicine

## 2021-05-14 ENCOUNTER — Encounter: Payer: Self-pay | Admitting: Family Medicine

## 2021-05-14 ENCOUNTER — Other Ambulatory Visit: Payer: Self-pay

## 2021-05-14 VITALS — BP 119/82 | HR 74 | Temp 98.6°F | Ht 65.0 in | Wt 207.4 lb

## 2021-05-14 DIAGNOSIS — F331 Major depressive disorder, recurrent, moderate: Secondary | ICD-10-CM | POA: Diagnosis not present

## 2021-05-14 DIAGNOSIS — F411 Generalized anxiety disorder: Secondary | ICD-10-CM | POA: Diagnosis not present

## 2021-05-14 MED ORDER — SERTRALINE HCL 100 MG PO TABS: 100.0000 mg | ORAL_TABLET | Freq: Every day | ORAL | 0 refills | Status: DC

## 2021-05-14 MED ORDER — BUPROPION HCL ER (SR) 150 MG PO TB12: 150.0000 mg | ORAL_TABLET | Freq: Every day | ORAL | 0 refills | Status: DC

## 2021-05-14 NOTE — Progress Notes (Signed)
Subjective: CC: Anxiety and depression PCP: Janora Norlander, DO HPI:Susan Whitney is a 60 y.o. female presenting to clinic today for:  1.  Anxiety and depression Patient reports symptoms have not been well controlled on her Zoloft over the last couple of months.  She has been on Zoloft for many years.  It did seem to work well after her mother's passing.  However, she notes that little things seem to be really getting to her.  She has increased pressure and stress at work, admits to low motivation and some isolated behaviors.  Energy has not been that great.  She feels self-conscious about her weight.  She admits to emotional eating.  No SI or HI.  She tries to keep an optimistic and upbeat attitude but sometimes it is very difficult.   ROS: Per HPI  No Known Allergies Past Medical History:  Diagnosis Date   Anxiety    Osteopenia    RLS (restless legs syndrome)     Current Outpatient Medications:    Black Cohosh 160 MG CAPS, black cohosh, Disp: , Rfl:    ibandronate (BONIVA) 150 MG tablet, Take 1 tablet (150 mg total) by mouth every 30 (thirty) days. Take in the morning with a full glass of water, on an empty stomach, and do not take anything else by mouth or lie down for the next 30 min., Disp: 3 tablet, Rfl: 3   meloxicam (MOBIC) 7.5 MG tablet, Take 1-2 tablets (7.5-15 mg total) by mouth daily as needed for pain., Disp: 180 tablet, Rfl: 1   sertraline (ZOLOFT) 50 MG tablet, TAKE 1 TABLET BY MOUTH EVERY DAY, Disp: 90 tablet, Rfl: 1   loperamide (IMODIUM A-D) 2 MG tablet, Take 2 tablets (4 mg total) by mouth 4 (four) times daily as needed for diarrhea or loose stools. (Patient not taking: Reported on 05/14/2021), Disp: 30 tablet, Rfl: 0   ondansetron (ZOFRAN) 4 MG tablet, Take 1 tablet (4 mg total) by mouth every 8 (eight) hours as needed for nausea or vomiting. (Patient not taking: Reported on 05/14/2021), Disp: 20 tablet, Rfl: 0   saccharomyces boulardii (FLORASTOR) 250 MG capsule,  Take 1 capsule (250 mg total) by mouth 2 (two) times daily. (Patient not taking: Reported on 05/14/2021), Disp: 60 capsule, Rfl: 0 Social History   Socioeconomic History   Marital status: Married    Spouse name: Not on file   Number of children: Not on file   Years of education: Not on file   Highest education level: Not on file  Occupational History   Not on file  Tobacco Use   Smoking status: Never   Smokeless tobacco: Never  Vaping Use   Vaping Use: Never used  Substance and Sexual Activity   Alcohol use: No   Drug use: No   Sexual activity: Not on file  Other Topics Concern   Not on file  Social History Narrative   Not on file   Social Determinants of Health   Financial Resource Strain: Not on file  Food Insecurity: Not on file  Transportation Needs: Not on file  Physical Activity: Not on file  Stress: Not on file  Social Connections: Not on file  Intimate Partner Violence: Not on file   Family History  Problem Relation Age of Onset   Emphysema Mother        smoked   Hip fracture Mother    Colon cancer Father    Hypertension Sister    Hypertension Brother  Objective: Office vital signs reviewed. BP 119/82   Pulse 74   Temp 98.6 F (37 C)   Ht 5\' 5"  (1.651 m)   Wt 207 lb 6.4 oz (94.1 kg)   SpO2 97%   BMI 34.51 kg/m   Physical Examination:  General: Awake, alert, well nourished, No acute distress Psych: Somewhat tearful during exam.  She is pleasant, interactive.  Thought process is linear.  Good eye contact.  Depression screen [facility] 2/9 05/14/2021 12/27/2020 12/27/2020  Decreased Interest 2 0 0  Down, Depressed, Hopeless 2 0 0  PHQ - 2 Score 4 0 0  Altered sleeping 3 0 -  Tired, decreased energy 3 0 -  Change in appetite 3 0 -  Feeling bad or failure about yourself  3 0 -  Trouble concentrating 3 0 -  Moving slowly or fidgety/restless 3 0 -  Suicidal thoughts 3 0 -  PHQ-9 Score 25 0 -  Difficult doing work/chores Very difficult Not difficult at  all -   GAD 7 : Generalized Anxiety Score 05/14/2021  Nervous, Anxious, on Edge 2  Control/stop worrying 2  Worry too much - different things 3  Trouble relaxing 3  Restless 2  Easily annoyed or irritable 3  Afraid - awful might happen 3  Total GAD 7 Score 18  Anxiety Difficulty Somewhat difficult    Assessment/ Plan: 60 y.o. female   Moderate episode of recurrent major depressive disorder (HCC) - Plan: sertraline (ZOLOFT) 100 MG tablet, buPROPion (WELLBUTRIN SR) 150 MG 12 hr tablet  Generalized anxiety disorder - Plan: sertraline (ZOLOFT) 100 MG tablet  Anxiety depression are not well controlled.  I am going to place her on Zoloft 100 mg daily as well as adding Wellbutrin SR.  She has had some issues with emotional eating and I wonder if Wellbutrin might be helpful in combating this as well as supporting her antidepressant.  She will be reassessed in 6 weeks, sooner if concerns arise  No orders of the defined types were placed in this encounter.  No orders of the defined types were placed in this encounter.    Janora Norlander, DO [place] [phone]

## 2021-05-16 ENCOUNTER — Ambulatory Visit: Payer: BC Managed Care – PPO | Admitting: Pharmacist

## 2021-06-11 ENCOUNTER — Ambulatory Visit: Payer: BC Managed Care – PPO | Admitting: Pharmacist

## 2021-06-19 ENCOUNTER — Ambulatory Visit: Payer: BC Managed Care – PPO | Admitting: Family Medicine

## 2021-06-20 ENCOUNTER — Ambulatory Visit (INDEPENDENT_AMBULATORY_CARE_PROVIDER_SITE_OTHER): Payer: BC Managed Care – PPO | Admitting: Pharmacist

## 2021-06-20 VITALS — Wt 207.0 lb

## 2021-06-20 DIAGNOSIS — Z6832 Body mass index (BMI) 32.0-32.9, adult: Secondary | ICD-10-CM

## 2021-06-20 DIAGNOSIS — Z7189 Other specified counseling: Secondary | ICD-10-CM

## 2021-06-20 MED ORDER — WEGOVY 0.25 MG/0.5ML ~~LOC~~ SOAJ: 0.2500 mg | SUBCUTANEOUS | 0 refills | Status: DC

## 2021-06-20 NOTE — Progress Notes (Signed)
    06/20/2021 Name: Susan Whitney MRN: [id number] DOB: 02/24/1961   S:  44 yoF Presents for weight loss evaluation, education, and management.  Patient was referred and last seen by Primary Care Provider on 05/14/20.  She has been trying to lose weight by dieting (low carb, keto, etc) with success, but tends to gain it back.  She would like to start on medication to assist her weight loss goals.     Insurance coverage/medication affordability: BCBS    Patient reports adherence with medications. Current medications for weight loss: N/A Has NOT tried any medications in the past    Patient is motivated to get more steps in/knows she needs to increase physical activity   She reports she has difficulty with snacking/sweets/portion control   Discussed meal planning options and Plate method for healthy eating Avoid sugary drinks and desserts Incorporate balanced protein, non starchy veggies, 1 serving of carbohydrate with each meal Increase water intake Increase physical activity as able   Goal weight is around  150lbs per BMI chart     A/P:   -Healthy eating and meal planning discussed   -Increase water and exercise   -Start Ozempic sample (0.25mg  sq weekly x 4weeks) while awaiting insurance coverage     -Denies personal and family history of Medullary thyroid cancer ([place])     -it appears Saxenda may be covered     -would like to do once weekly injection-->Wegovy called in     -Work to eat low fat-smaller meals to reduce side effects     -Decrease carbonated beverages    Written patient instructions provided.  Total time in face to face counseling 25 minutes.   Regina Eck, PharmD, BCPS Clinical Pharmacist, [place]  [facility] Phone [phone]

## 2021-06-25 ENCOUNTER — Telehealth: Payer: Self-pay | Admitting: *Deleted

## 2021-06-25 ENCOUNTER — Telehealth: Payer: Self-pay | Admitting: Pharmacist

## 2021-06-25 DIAGNOSIS — Z6832 Body mass index (BMI) 32.0-32.9, adult: Secondary | ICD-10-CM

## 2021-06-25 MED ORDER — SAXENDA 18 MG/3ML ~~LOC~~ SOPN: 3.0000 mg | PEN_INJECTOR | Freq: Every day | SUBCUTANEOUS | 5 refills | Status: DC

## 2021-06-25 NOTE — Telephone Encounter (Signed)
PA started for pt  (Key: [id number]) Saxenda

## 2021-07-02 NOTE — Telephone Encounter (Signed)
Per plan -PA not required

## 2021-07-18 ENCOUNTER — Encounter: Payer: Self-pay | Admitting: Nurse Practitioner

## 2021-07-18 ENCOUNTER — Ambulatory Visit (INDEPENDENT_AMBULATORY_CARE_PROVIDER_SITE_OTHER): Payer: BC Managed Care – PPO | Admitting: Nurse Practitioner

## 2021-07-18 DIAGNOSIS — J069 Acute upper respiratory infection, unspecified: Secondary | ICD-10-CM | POA: Diagnosis not present

## 2021-07-18 MED ORDER — PREDNISONE 10 MG (21) PO TBPK: ORAL_TABLET | ORAL | 0 refills | Status: DC

## 2021-07-18 MED ORDER — AMOXICILLIN-POT CLAVULANATE 875-125 MG PO TABS: 1.0000 | ORAL_TABLET | Freq: Two times a day (BID) | ORAL | 0 refills | Status: DC

## 2021-07-18 MED ORDER — ACETAMINOPHEN 500 MG PO TABS: 500.0000 mg | ORAL_TABLET | Freq: Four times a day (QID) | ORAL | 0 refills | Status: DC | PRN

## 2021-07-18 NOTE — Patient Instructions (Signed)

## 2021-07-18 NOTE — Progress Notes (Signed)
° °  Virtual Visit  Note Due to COVID-19 pandemic this visit was conducted virtually. This visit type was conducted due to national recommendations for restrictions regarding the COVID-19 Pandemic (e.g. social distancing, sheltering in place) in an effort to limit this patient's exposure and mitigate transmission in our community. All issues noted in this document were discussed and addressed.  A physical exam was not performed with this format.  I connected with Susan Whitney on 07/18/21 at 4:20 PM by telephone and verified that I am speaking with the correct person using two identifiers. Susan Whitney is currently located at work during visit. The provider, Ivy Lynn, NP is located in their office at time of visit.  I discussed the limitations, risks, security and privacy concerns of performing an evaluation and management service by telephone and the availability of in person appointments. I also discussed with the patient that there may be a patient responsible charge related to this service. The patient expressed understanding and agreed to proceed.   History and Present Illness:  URI  This is a new problem. Episode onset: In the past few days. The problem has been unchanged. There has been no fever. Associated symptoms include congestion, headaches and sinus pain. Pertinent negatives include no ear pain, rash or sore throat. She has tried decongestant for the symptoms. The treatment provided mild relief.     Review of Systems  Constitutional:  Negative for chills, fever and malaise/fatigue.  HENT:  Positive for congestion and sinus pain. Negative for ear discharge, ear pain and sore throat.   Cardiovascular: Negative.   Skin:  Negative for rash.  Neurological:  Positive for headaches.  All other systems reviewed and are negative.   Observations/Objective: Televisit patient not in distress.  Assessment and Plan: Take meds as prescribed - Use a cool mist humidifier  -Use saline  nose sprays frequently -Force fluids -For fever or aches or pains- take Tylenol or ibuprofen. -Augmentin 875-175 mg tablet by mouth twice daily. -Prednisone taper -If symptoms do not improve, she may need to be COVID tested to rule this out Follow up with worsening unresolved symptoms   Follow Up Instructions: Follow-up with worsening unresolved symptoms.    I discussed the assessment and treatment plan with the patient. The patient was provided an opportunity to ask questions and all were answered. The patient agreed with the plan and demonstrated an understanding of the instructions.   The patient was advised to call back or seek an in-person evaluation if the symptoms worsen or if the condition fails to improve as anticipated.  The above assessment and management plan was discussed with the patient. The patient verbalized understanding of and has agreed to the management plan. Patient is aware to call the clinic if symptoms persist or worsen. Patient is aware when to return to the clinic for a follow-up visit. Patient educated on when it is appropriate to go to the emergency department.   Time call ended: 4:28 PM  I provided 8 minutes of  non face-to-face time during this encounter.    Ivy Lynn, NP

## 2021-07-18 NOTE — Assessment & Plan Note (Signed)
Take meds as prescribed - Use a cool mist humidifier  -Use saline nose sprays frequently -Force fluids -For fever or aches or pains- take Tylenol or ibuprofen. -Augmentin 875-175 mg tablet by mouth twice daily. -Prednisone taper -If symptoms do not improve, she may need to be COVID tested to rule this out Follow up with worsening unresolved symptoms

## 2021-08-05 ENCOUNTER — Other Ambulatory Visit: Payer: Self-pay | Admitting: Family Medicine

## 2021-08-05 DIAGNOSIS — F411 Generalized anxiety disorder: Secondary | ICD-10-CM

## 2021-08-05 DIAGNOSIS — F331 Major depressive disorder, recurrent, moderate: Secondary | ICD-10-CM

## 2021-10-04 ENCOUNTER — Encounter: Payer: Self-pay | Admitting: Family Medicine

## 2021-10-04 ENCOUNTER — Ambulatory Visit: Payer: BC Managed Care – PPO | Admitting: Family Medicine

## 2021-10-04 VITALS — BP 125/84 | HR 72 | Temp 97.3°F | Ht 65.0 in | Wt 181.2 lb

## 2021-10-04 DIAGNOSIS — M810 Age-related osteoporosis without current pathological fracture: Secondary | ICD-10-CM | POA: Diagnosis not present

## 2021-10-04 DIAGNOSIS — H9193 Unspecified hearing loss, bilateral: Secondary | ICD-10-CM

## 2021-10-04 DIAGNOSIS — R1310 Dysphagia, unspecified: Secondary | ICD-10-CM

## 2021-10-04 DIAGNOSIS — K219 Gastro-esophageal reflux disease without esophagitis: Secondary | ICD-10-CM

## 2021-10-04 MED ORDER — PANTOPRAZOLE SODIUM 40 MG PO TBEC: 40.0000 mg | DELAYED_RELEASE_TABLET | Freq: Every day | ORAL | 0 refills | Status: DC

## 2021-10-04 MED ORDER — RISEDRONATE SODIUM 150 MG PO TABS: 150.0000 mg | ORAL_TABLET | ORAL | 4 refills | Status: DC

## 2021-10-04 NOTE — Progress Notes (Signed)
? ?Subjective: ?CC: Abnormal hearing ?PCP: Janora Norlander, DO ?HPI:Susan Whitney is a 61 y.o. female presenting to clinic today for: ? ?1.  Decreased hearing ?Patient reports that she has been experiencing decreased hearing for a bit now.  She does not report any trauma to the eardrums but sometimes does feel like she is able to pop her eardrums and temporarily will get slightly better hearing but then this goes away.  Does not report any significant drainage. ? ?2.  GERD, dysphagia ?Patient reports that she has been having some heartburn.  She feels like she has a globus sensation sometimes when she eats.  This is not associated with any particular foods.  Symptoms have been increasing in frequency and this worries her.  Not currently treated with any antacids.  No reports of nausea, vomiting or blood in stool.  Symptoms were not exacerbated by use of Saxenda ? ?3.  Osteoporosis ?Currently treated with Boniva.  She is tolerating this without difficulty except for the heartburn as above. ? ? ?ROS: Per HPI ? ?No Known Allergies ?Past Medical History:  ?Diagnosis Date  ? Anxiety   ? Osteopenia   ? RLS (restless legs syndrome)   ? ? ?Current Outpatient Medications:  ?  acetaminophen (TYLENOL) 500 MG tablet, Take 1 tablet (500 mg total) by mouth every 6 (six) hours as needed., Disp: 30 tablet, Rfl: 0 ?  Black Cohosh 160 MG CAPS, black cohosh, Disp: , Rfl:  ?  buPROPion (WELLBUTRIN SR) 150 MG 12 hr tablet, TAKE 1 TABLET BY MOUTH EVERY DAY, Disp: 90 tablet, Rfl: 0 ?  ibandronate (BONIVA) 150 MG tablet, Take 1 tablet (150 mg total) by mouth every 30 (thirty) days. Take in the morning with a full glass of water, on an empty stomach, and do not take anything else by mouth or lie down for the next 30 min., Disp: 3 tablet, Rfl: 3 ?  Liraglutide -Weight Management (SAXENDA) 18 MG/3ML SOPN, Inject 3 mg into the skin daily., Disp: 15 mL, Rfl: 5 ?  sertraline (ZOLOFT) 100 MG tablet, TAKE 1 TABLET BY MOUTH EVERY DAY, Disp: 90  tablet, Rfl: 0 ?Social History  ? ?Socioeconomic History  ? Marital status: Married  ?  Spouse name: Not on file  ? Number of children: Not on file  ? Years of education: Not on file  ? Highest education level: Not on file  ?Occupational History  ? Not on file  ?Tobacco Use  ? Smoking status: Never  ? Smokeless tobacco: Never  ?Vaping Use  ? Vaping Use: Never used  ?Substance and Sexual Activity  ? Alcohol use: No  ? Drug use: No  ? Sexual activity: Not on file  ?Other Topics Concern  ? Not on file  ?Social History Narrative  ? Not on file  ? ?Social Determinants of Health  ? ?Financial Resource Strain: Not on file  ?Food Insecurity: Not on file  ?Transportation Needs: Not on file  ?Physical Activity: Not on file  ?Stress: Not on file  ?Social Connections: Not on file  ?Intimate Partner Violence: Not on file  ? ?Family History  ?Problem Relation Age of Onset  ? Emphysema Mother   ?     smoked  ? Hip fracture Mother   ? Colon cancer Father   ? Hypertension Sister   ? Hypertension Brother   ? ? ?Objective: ?Office vital signs reviewed. ?BP 125/84   Pulse 72   Temp (!) 97.3 ?F (36.3 ?C) (Temporal)  Ht '5\' 5"'$  (1.651 m)   Wt 181 lb 3.2 oz (82.2 kg)   SpO2 100%   BMI 30.15 kg/m?  ? ?Physical Examination:  ?General: Awake, alert, well nourished, No acute distress ?HEENT: Sclera white.  Moist mucous membranes.  TMs intact bilaterally.  No structural abnormalities appreciated ?Cardio: regular rate and rhythm, S1S2 heard, no murmurs appreciated ?Pulm: clear to auscultation bilaterally, no wheezes, rhonchi or rales; normal work of breathing on room air ?GI: soft, non-tender, non-distended, bowel sounds present x4, no hepatomegaly, no splenomegaly, no masses ? ?Hearing Screening  ?Method: Audiometry  ? '500Hz'$  '1000Hz'$  '2000Hz'$  '4000Hz'$   ?Right ear Fail Fail Fail Fail  ?Left ear Pass Pass Pass Pass  ?Comments: 40 dBHL  ? ?Assessment/ Plan: ?61 y.o. female  ? ?Hearing difficulty of both ears - Plan: Ambulatory referral to  ENT ? ?Dysphagia, unspecified type - Plan: pantoprazole (PROTONIX) 40 MG tablet ? ?Gastroesophageal reflux disease without esophagitis - Plan: pantoprazole (PROTONIX) 40 MG tablet ? ?Age-related osteoporosis without current pathological fracture - Plan: risedronate (ACTONEL) 150 MG tablet ? ?Referral to ENT placed.  She has some asymmetry on her hearing exam.  Would like formal review and possibly repeat hearing test.  Ear exam was unremarkable ? ?I think that dysphagia she is experiencing is likely reflective of uncontrolled GERD.  Trial of PPI.  If ongoing symptoms, we will plan to refer to [facility] ? ?I have switched her from Boniva to Actonel since Actonel has shown superiority over Boniva.  Not yet due for repeat scan ? ?No orders of the defined types were placed in this encounter. ? ?No orders of the defined types were placed in this encounter. ? ? ? ?Janora Norlander, DO ?[place] ?[phone] ? ? ?

## 2021-10-08 ENCOUNTER — Encounter: Payer: Self-pay | Admitting: *Deleted

## 2021-10-19 ENCOUNTER — Other Ambulatory Visit: Payer: Self-pay | Admitting: Family Medicine

## 2021-10-19 DIAGNOSIS — F331 Major depressive disorder, recurrent, moderate: Secondary | ICD-10-CM

## 2021-10-19 DIAGNOSIS — F411 Generalized anxiety disorder: Secondary | ICD-10-CM

## 2021-10-28 ENCOUNTER — Other Ambulatory Visit: Payer: Self-pay | Admitting: Family Medicine

## 2021-10-28 DIAGNOSIS — K219 Gastro-esophageal reflux disease without esophagitis: Secondary | ICD-10-CM

## 2021-10-28 DIAGNOSIS — R1310 Dysphagia, unspecified: Secondary | ICD-10-CM

## 2021-11-11 ENCOUNTER — Other Ambulatory Visit: Payer: Self-pay | Admitting: Family Medicine

## 2021-11-11 DIAGNOSIS — K219 Gastro-esophageal reflux disease without esophagitis: Secondary | ICD-10-CM

## 2021-11-11 DIAGNOSIS — R1310 Dysphagia, unspecified: Secondary | ICD-10-CM

## 2021-11-28 ENCOUNTER — Telehealth: Payer: Self-pay | Admitting: Family Medicine

## 2021-11-28 DIAGNOSIS — D1723 Benign lipomatous neoplasm of skin and subcutaneous tissue of right leg: Secondary | ICD-10-CM

## 2021-11-28 NOTE — Telephone Encounter (Signed)
REFERRAL REQUEST ?Telephone Note ? ?Have you been seen at our office for this problem? yes ?(Advise that they may need an appointment with their PCP before a referral can be done) ? ?Reason for Referral: Lipoma on right leg ?Referral discussed with patient: yes ?Best contact number of patient for referral team: [phone] ?Has patient been seen by a specialist for this issue before: no  ?Patient provider preference for referral: [facility] ?Patient location preference for referral: [place] ?  ?Patient notified that referrals can take up to a week or longer to process. If they haven't heard anything within a week they should call back and speak with the referral department.  yes ?

## 2021-11-29 NOTE — Telephone Encounter (Signed)
done

## 2021-11-30 ENCOUNTER — Other Ambulatory Visit: Payer: Self-pay | Admitting: Family Medicine

## 2021-11-30 DIAGNOSIS — M810 Age-related osteoporosis without current pathological fracture: Secondary | ICD-10-CM

## 2021-12-06 ENCOUNTER — Encounter: Payer: Self-pay | Admitting: Family Medicine

## 2021-12-06 ENCOUNTER — Ambulatory Visit: Payer: BC Managed Care – PPO | Admitting: Family Medicine

## 2021-12-06 VITALS — BP 113/69 | HR 80 | Temp 97.2°F | Ht 65.0 in | Wt 181.2 lb

## 2021-12-06 DIAGNOSIS — W57XXXA Bitten or stung by nonvenomous insect and other nonvenomous arthropods, initial encounter: Secondary | ICD-10-CM

## 2021-12-06 DIAGNOSIS — S0006XA Insect bite (nonvenomous) of scalp, initial encounter: Secondary | ICD-10-CM

## 2021-12-06 MED ORDER — DOXYCYCLINE HYCLATE 100 MG PO TABS: 100.0000 mg | ORAL_TABLET | Freq: Two times a day (BID) | ORAL | 0 refills | Status: AC

## 2021-12-06 NOTE — Progress Notes (Signed)
Subjective: CC: Tick bite PCP: Janora Norlander, DO HPI:Susan Whitney is a 61 y.o. female presenting to clinic today for:  1.  Tick bite Patient sustained a tick bite behind her right ear near the her scalp.  It was identified yesterday when she was having a little itching in this area.  Her daughter removed it but thinks that maybe the head did not come out.  She does not report any fevers, any myalgia or arthralgias.  No new fatigue or rashes.   ROS: Per HPI  No Known Allergies Past Medical History:  Diagnosis Date   Anxiety    Osteopenia    RLS (restless legs syndrome)     Current Outpatient Medications:    doxycycline (VIBRA-TABS) 100 MG tablet, Take 1 tablet (100 mg total) by mouth 2 (two) times daily for 10 days., Disp: 20 tablet, Rfl: 0   acetaminophen (TYLENOL) 500 MG tablet, Take 1 tablet (500 mg total) by mouth every 6 (six) hours as needed., Disp: 30 tablet, Rfl: 0   Black Cohosh 160 MG CAPS, black cohosh, Disp: , Rfl:    buPROPion (WELLBUTRIN SR) 150 MG 12 hr tablet, TAKE 1 TABLET BY MOUTH EVERY DAY, Disp: 90 tablet, Rfl: 0   Liraglutide -Weight Management (SAXENDA) 18 MG/3ML SOPN, Inject 3 mg into the skin daily., Disp: 15 mL, Rfl: 5   pantoprazole (PROTONIX) 40 MG tablet, TAKE 1 TABLET BY MOUTH EVERY DAY, Disp: 90 tablet, Rfl: 1   risedronate (ACTONEL) 150 MG tablet, Take 1 tablet (150 mg total) by mouth every 30 (thirty) days. with water on empty stomach, nothing by mouth or lie down for next 30 minutes., Disp: 3 tablet, Rfl: 4   sertraline (ZOLOFT) 100 MG tablet, TAKE 1 TABLET BY MOUTH EVERY DAY, Disp: 90 tablet, Rfl: 0 Social History   Socioeconomic History   Marital status: Married    Spouse name: Not on file   Number of children: Not on file   Years of education: Not on file   Highest education level: Not on file  Occupational History   Not on file  Tobacco Use   Smoking status: Never   Smokeless tobacco: Never  Vaping Use   Vaping Use: Never used   Substance and Sexual Activity   Alcohol use: No   Drug use: No   Sexual activity: Not on file  Other Topics Concern   Not on file  Social History Narrative   Not on file   Social Determinants of Health   Financial Resource Strain: Not on file  Food Insecurity: Not on file  Transportation Needs: Not on file  Physical Activity: Not on file  Stress: Not on file  Social Connections: Not on file  Intimate Partner Violence: Not on file   Family History  Problem Relation Age of Onset   Emphysema Mother        smoked   Hip fracture Mother    Colon cancer Father    Hypertension Sister    Hypertension Brother     Objective: Office vital signs reviewed. BP 113/69   Pulse 80   Temp (!) 97.2 F (36.2 C)   Ht '5\' 5"'$  (1.651 m)   Wt 181 lb 3.2 oz (82.2 kg)   SpO2 97%   BMI 30.15 kg/m   Physical Examination:  General: Awake, alert, well nourished, No acute distress Skin: Patient has a mildly erythematous lesion with a central black dot along the right side of the lower scalp just  posterior to the ear.  Assessment/ Plan: 61 y.o. female   Tick bite of scalp, initial encounter - Plan: doxycycline (VIBRA-TABS) 100 MG tablet  Given unknown duration of tick attachment we will go ahead and empirically treat with doxycycline.  After verbal consent obtained, I removed what looked to be the head of the tick with a sterile needle today.  She tolerated the procedure without difficulty.  It sounds like the daughter was removing it she mashed on it quite a bit which does increase her risk of contracting Lyme disease.  We discussed red flag signs symptoms to look for.  Follow-up as needed  No orders of the defined types were placed in this encounter.  Meds ordered this encounter  Medications   doxycycline (VIBRA-TABS) 100 MG tablet    Sig: Take 1 tablet (100 mg total) by mouth 2 (two) times daily for 10 days.    Dispense:  20 tablet    Refill:  [place], DO [place] [phone]

## 2021-12-26 ENCOUNTER — Other Ambulatory Visit: Payer: Self-pay | Admitting: Physician Assistant

## 2021-12-26 DIAGNOSIS — H903 Sensorineural hearing loss, bilateral: Secondary | ICD-10-CM

## 2022-01-18 ENCOUNTER — Other Ambulatory Visit: Payer: Self-pay | Admitting: Family Medicine

## 2022-01-18 DIAGNOSIS — F411 Generalized anxiety disorder: Secondary | ICD-10-CM

## 2022-01-18 DIAGNOSIS — F331 Major depressive disorder, recurrent, moderate: Secondary | ICD-10-CM

## 2022-02-07 ENCOUNTER — Ambulatory Visit: Payer: BC Managed Care – PPO

## 2022-02-17 ENCOUNTER — Ambulatory Visit: Payer: BC Managed Care – PPO

## 2022-02-19 ENCOUNTER — Ambulatory Visit: Payer: BC Managed Care – PPO

## 2022-02-20 ENCOUNTER — Ambulatory Visit: Payer: BC Managed Care – PPO

## 2022-03-03 ENCOUNTER — Ambulatory Visit (INDEPENDENT_AMBULATORY_CARE_PROVIDER_SITE_OTHER): Payer: BC Managed Care – PPO | Admitting: Family

## 2022-03-03 ENCOUNTER — Encounter: Payer: Self-pay | Admitting: Family

## 2022-03-03 DIAGNOSIS — U071 COVID-19: Secondary | ICD-10-CM | POA: Diagnosis not present

## 2022-03-03 MED ORDER — MOLNUPIRAVIR EUA 200MG CAPSULE: 4.0000 | ORAL_CAPSULE | Freq: Two times a day (BID) | ORAL | 0 refills | Status: AC

## 2022-03-03 NOTE — Progress Notes (Signed)
Virtual Visit  Note Due to COVID-19 pandemic this visit was conducted virtually. This visit type was conducted due to national recommendations for restrictions regarding the COVID-19 Pandemic (e.g. social distancing, sheltering in place) in an effort to limit this patient's exposure and mitigate transmission in our community. All issues noted in this document were discussed and addressed.  A physical exam was not performed with this format.  I connected with [name] on 03/03/22 at 4:56 pm  by telephone and verified that I am speaking with the correct person using two identifiers. SHANNARA WINBUSH is currently located at home and husband is currently with her during visit. The provider, Evelina Dun, FNP is located in their office at time of visit.  I discussed the limitations, risks, security and privacy concerns of performing an evaluation and management service by telephone and the availability of in person appointments. I also discussed with the patient that there may be a patient responsible charge related to this service. The patient expressed understanding and agreed to proceed.  Ms. tijah, hane are scheduled for a virtual visit with your provider today.    Just as we do with appointments in the office, we must obtain your consent to participate.  Your consent will be active for this visit and any virtual visit you may have with one of our providers in the next 365 days.    If you have a MyChart account, I can also send a copy of this consent to you electronically.  All virtual visits are billed to your insurance company just like a traditional visit in the office.  As this is a virtual visit, video technology does not allow for your provider to perform a traditional examination.  This may limit your provider's ability to fully assess your condition.  If your provider identifies any concerns that need to be evaluated in person or the need to arrange testing such as labs, EKG, etc, we will make  arrangements to do so.    Although advances in technology are sophisticated, we cannot ensure that it will always work on either your end or our end.  If the connection with a video visit is poor, we may have to switch to a telephone visit.  With either a video or telephone visit, we are not always able to ensure that we have a secure connection.   I need to obtain your verbal consent now.   Are you willing to proceed with your visit today?   TAEGAN HAIDER has provided verbal consent on 03/03/2022 for a virtual visit (video or telephone).   Evelina Dun, [place] 03/03/2022  4:56 PM   History and Present Illness:  Pt calls the office today with COVID. States her husband tested for positive yesterday. States her symptoms started today and tested positive today for COVID.   URI  This is a new problem. The current episode started today. The maximum temperature recorded prior to her arrival was 100.4 - 100.9 F. Associated symptoms include congestion (dry), coughing, headaches, joint swelling, rhinorrhea, sneezing and a sore throat. Pertinent negatives include no ear pain or nausea. She has tried NSAIDs for the symptoms. The treatment provided mild relief.      Review of Systems  HENT:  Positive for congestion (dry), rhinorrhea, sneezing and sore throat. Negative for ear pain.   Respiratory:  Positive for cough.   Gastrointestinal:  Negative for nausea.  Neurological:  Positive for headaches.     Observations/Objective: No SOB or distress  noted   Assessment and Plan: 1. Telehealth encounter for confirmed COVID-19 COVID positive, rest, force fluids, tylenol as needed, Quarantine for at least 5 days and you are fever free, then must wear a mask out in public from day 5-52, report any worsening symptoms such as increased shortness of breath, swelling, or continued high fevers. Possible adverse effects discussed with antivirals.  - molnupiravir EUA (LAGEVRIO) 200 mg CAPS capsule; Take 4 capsules  (800 mg total) by mouth 2 (two) times daily for 5 days.  Dispense: 40 capsule; Refill: 0     I discussed the assessment and treatment plan with the patient. The patient was provided an opportunity to ask questions and all were answered. The patient agreed with the plan and demonstrated an understanding of the instructions.   The patient was advised to call back or seek an in-person evaluation if the symptoms worsen or if the condition fails to improve as anticipated.  The above assessment and management plan was discussed with the patient. The patient verbalized understanding of and has agreed to the management plan. Patient is aware to call the clinic if symptoms persist or worsen. Patient is aware when to return to the clinic for a follow-up visit. Patient educated on when it is appropriate to go to the emergency department.   Time call ended:  5:07 pm   I provided 11 minutes of  non face-to-face time during this encounter.    Evelina Dun, FNP

## 2022-04-23 ENCOUNTER — Other Ambulatory Visit: Payer: Self-pay | Admitting: Family Medicine

## 2022-04-23 ENCOUNTER — Encounter: Payer: Self-pay | Admitting: Family Medicine

## 2022-04-23 ENCOUNTER — Ambulatory Visit: Payer: BC Managed Care – PPO | Admitting: Family Medicine

## 2022-04-23 VITALS — BP 115/75 | HR 74 | Temp 98.0°F | Ht 65.0 in | Wt 174.6 lb

## 2022-04-23 DIAGNOSIS — H16302 Unspecified interstitial keratitis, left eye: Secondary | ICD-10-CM

## 2022-04-23 DIAGNOSIS — F411 Generalized anxiety disorder: Secondary | ICD-10-CM

## 2022-04-23 DIAGNOSIS — F331 Major depressive disorder, recurrent, moderate: Secondary | ICD-10-CM

## 2022-04-23 NOTE — Patient Instructions (Signed)
Corneal Abrasion  A corneal abrasion is a scratch or injury to the clear covering over the front of your eye (cornea). This can be painful. It is important to get treatment for a corneal abrasion. If this problem is not treated, it can affect your eyesight (vision). What are the causes? A poke in the eye. An object in the eye. Too much eye rubbing. Very dry eyes. Certain eye infections. Contact lenses that do not fit right or are worn for too long. You can also injure your cornea when putting contact lenses in your eye or taking them out. Eye surgery. Certain cornea problems may increase the chance of a corneal abrasion. Sometimes, the cause is not known. What are the signs or symptoms? Eye pain. The pain may get worse when you open and close your eye or when you move your eye. A feeling of something stuck in your eye. Tearing, redness, and sensitivity to light. Having trouble keeping your eye open, or not being able to keep it open. Blurred vision. Headache. How is this diagnosed? You may work with a health care provider who specializes in conditions of the eye (ophthalmologist). This condition may be diagnosed based on your medical history, symptoms, and an eye exam. How is this treated? Washing out your eye. Removing anything that is stuck in your eye. Using antibiotic drops or ointment to treat or prevent an infection. Using a dilating drop to decrease irritation, swelling, and pain. Using steroid drops or ointment to treat redness, irritation, or swelling. Applying a cold, wet cloth (cold compress) or ice pack to ease the pain Taking pain medicine by mouth. In some cases, an eye patch or bandage soft contact lens might also be used. An eye patch should not be used if the corneal abrasion was related to contact lens wear as it can increase the chance of infection in these eyes. Follow these instructions at home: Medicines Use eye drops or ointments as told by your doctor. If you  were prescribed antibiotic drops or ointment, use them as told by your doctor. Do not stop using the antibiotic even if you start to feel better. Take over-the-counter and prescription medicines only as told by your doctor. Ask your doctor if the medicine prescribed to you: Requires you to avoid driving or using heavy machinery. Can cause trouble pooping (constipation). You may need to take these actions to prevent or treat trouble pooping: Drink enough fluid to keep your pee (urine) pale yellow. Take over-the-counter or prescription medicines. Eat foods that are high in fiber. These include beans, whole grains, and fresh fruits and vegetables. Limit foods that are high in fat and processed sugars. These include fried or sweet foods. Using an eye patch If you have an eye patch, wear it as told by your doctor. Do not drive or use machinery while wearing an eye patch. Follow instructions from your doctor about when to take off the patch. General instructions Ask your doctor if you can use a cold, wet cloth on your eye to help with pain. Do not rub or touch your eye. Do not wash out your eye. Do not wear contact lenses until your doctor says that this is okay. Avoid bright light. Avoid straining your eyes. Keep all follow-up visits as told by your doctor. Doing this can help to prevent infection and loss of eyesight. Contact a doctor if: You keep having eye pain and other symptoms for more than 2 days. You get new symptoms, such as more  redness, watery eyes, or discharge. You have discharge that makes your eyelids stick together in the morning. Your eye patch becomes so loose that you can blink your eye. Symptoms come back after your eye heals. Get help right away if: You have very bad eye pain that does not get better with medicine. You lose eyesight. Summary A corneal abrasion is a scratch or injury to the clear covering over the front of the eye (cornea). It is important to get  treatment for a corneal abrasion. If this problem is not treated, it can affect your eyesight (vision). Use eye drops or ointments as told by your doctor. If you have an eye patch, do not drive or use machinery while wearing it. Let your doctor know if your symptoms last for more than 2 days. This information is not intended to replace advice given to you by your health care provider. Make sure you discuss any questions you have with your health care provider. Document Revised: 11/12/2018 Document Reviewed: 11/12/2018 Elsevier Patient Education  [place].

## 2022-04-23 NOTE — Progress Notes (Signed)
Subjective: CC: Eye burning PCP: Janora Norlander, DO HPI:Susan Whitney is a 61 y.o. female presenting to clinic today for:  1.  Eye burning Patient reports abrupt onset of left-sided eye burning in the middle of the night.  She notes it woke her up from sleep and that she has had some blurred vision in that left eye.  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  She denies having gotten anything in the eye.  She does not wear contacts.  She notes that they are working on her home and that maybe there is some sawdust in the air but does not recall actually getting any in her eye.  Has not utilize any treatments for this yet   ROS: Per HPI  No Known Allergies Past Medical History:  Diagnosis Date   Anxiety    Osteopenia    RLS (restless legs syndrome)     Current Outpatient Medications:    acetaminophen (TYLENOL) 500 MG tablet, Take 1 tablet (500 mg total) by mouth every 6 (six) hours as needed., Disp: 30 tablet, Rfl: 0   Black Cohosh 160 MG CAPS, black cohosh, Disp: , Rfl:    buPROPion (WELLBUTRIN SR) 150 MG 12 hr tablet, TAKE 1 TABLET BY MOUTH EVERY DAY, Disp: 90 tablet, Rfl: 0   risedronate (ACTONEL) 150 MG tablet, Take 1 tablet (150 mg total) by mouth every 30 (thirty) days. with water on empty stomach, nothing by mouth or lie down for next 30 minutes., Disp: 3 tablet, Rfl: 4   sertraline (ZOLOFT) 100 MG tablet, TAKE 1 TABLET BY MOUTH EVERY DAY, Disp: 90 tablet, Rfl: 0 Social History   Socioeconomic History   Marital status: Married    Spouse name: Not on file   Number of children: Not on file   Years of education: Not on file   Highest education level: Not on file  Occupational History   Not on file  Tobacco Use   Smoking status: Never   Smokeless tobacco: Never  Vaping Use   Vaping Use: Never used  Substance and Sexual Activity   Alcohol use: No   Drug use: No   Sexual activity: Not on file  Other Topics Concern    Not on file  Social History Narrative   Not on file   Social Determinants of Health   Financial Resource Strain: Not on file  Food Insecurity: Not on file  Transportation Needs: Not on file  Physical Activity: Not on file  Stress: Not on file  Social Connections: Not on file  Intimate Partner Violence: Not on file   Family History  Problem Relation Age of Onset   Emphysema Mother        smoked   Hip fracture Mother    Colon cancer Father    Hypertension Sister    Hypertension Brother     Objective: Office vital signs reviewed. BP 115/75   Pulse 74   Temp 98 F (36.7 C)   Ht '5\' 5"'$  (1.651 m)   Wt 174 lb 9.6 oz (79.2 kg)   SpO2 98%   BMI 29.05 kg/m   Physical Examination:  General: Awake, alert,  No acute distress Left eye: No conjunctival injection appreciated.  No foreign body appreciated. Fluorescein eye exam performed which did demonstrate a wedgelike abrasion noted along the 5 o'clock position of the left cornea.  Assessment/ Plan: 61 y.o. female   Stromal keratitis,  left  I performed a fluorescein dye exam on her eye and there was an abrasion noted at approximately the 5 o'clock position of the cornea.  Because she has had complications in her right eye previously I did go ahead and reach out to our local optometrist, Dr. Marin Comment, to see if perhaps she could do a more in-depth eye exam on her.  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  She was prescribed antiviral eyedrops as well as oral Valtrex 500 mg.  Will need shingles shot in about a month  No orders of the defined types were placed in this encounter.  No orders of the defined types were placed in this encounter.    Janora Norlander, DO [place] [phone]

## 2022-05-13 ENCOUNTER — Other Ambulatory Visit: Payer: Self-pay | Admitting: Family Medicine

## 2022-05-13 DIAGNOSIS — F331 Major depressive disorder, recurrent, moderate: Secondary | ICD-10-CM

## 2022-06-03 ENCOUNTER — Encounter: Payer: Self-pay | Admitting: Family Medicine

## 2022-06-03 ENCOUNTER — Ambulatory Visit: Payer: BC Managed Care – PPO | Admitting: Family Medicine

## 2022-06-03 VITALS — BP 121/74 | HR 68 | Temp 97.9°F | Ht 65.0 in | Wt 176.4 lb

## 2022-06-03 DIAGNOSIS — M810 Age-related osteoporosis without current pathological fracture: Secondary | ICD-10-CM | POA: Diagnosis not present

## 2022-06-03 DIAGNOSIS — E611 Iron deficiency: Secondary | ICD-10-CM

## 2022-06-03 DIAGNOSIS — E7800 Pure hypercholesterolemia, unspecified: Secondary | ICD-10-CM

## 2022-06-03 DIAGNOSIS — Z0000 Encounter for general adult medical examination without abnormal findings: Secondary | ICD-10-CM

## 2022-06-03 DIAGNOSIS — F411 Generalized anxiety disorder: Secondary | ICD-10-CM | POA: Diagnosis not present

## 2022-06-03 DIAGNOSIS — F331 Major depressive disorder, recurrent, moderate: Secondary | ICD-10-CM | POA: Diagnosis not present

## 2022-06-03 DIAGNOSIS — Z974 Presence of external hearing-aid: Secondary | ICD-10-CM

## 2022-06-03 DIAGNOSIS — E559 Vitamin D deficiency, unspecified: Secondary | ICD-10-CM

## 2022-06-03 DIAGNOSIS — Z0001 Encounter for general adult medical examination with abnormal findings: Secondary | ICD-10-CM

## 2022-06-03 MED ORDER — BUPROPION HCL ER (SR) 150 MG PO TB12: 150.0000 mg | ORAL_TABLET | Freq: Every day | ORAL | 3 refills | Status: DC

## 2022-06-03 MED ORDER — RISEDRONATE SODIUM 150 MG PO TABS: 150.0000 mg | ORAL_TABLET | ORAL | 4 refills | Status: DC

## 2022-06-03 MED ORDER — SERTRALINE HCL 100 MG PO TABS: 100.0000 mg | ORAL_TABLET | Freq: Every day | ORAL | 3 refills | Status: DC

## 2022-06-03 NOTE — Progress Notes (Signed)
Susan Whitney is a 61 y.o. female presents to office today for annual physical exam examination.    Concerns today include: 1.  None.  Just needs refills  Occupation: Glass blower/designer, Marital status: married, Substance use: none Diet: typical [place], Exercise: no structured Last eye exam: UTD Last dental exam: UTD Last colonoscopy: UTD Last mammogram: UTD Last pap smear: UTD, sees [place] needed today: all Immunizations needed: Shingrix/ Tdap. Waiting until 2024 Immunization History  Administered Date(s) Administered   Influenza Split 05/03/2013   Influenza,inj,Quad PF,6+ Mos 04/23/2015, 04/18/2016, 05/01/2017, 04/14/2018, 05/11/2019   Influenza,inj,Quad PF,6-35 Mos 05/01/2014   Influenza-Unspecified 04/21/2015, 05/21/2016   Moderna SARS-COV2 Booster Vaccination 07/04/2020   Moderna Sars-Covid-2 Vaccination 08/15/2019, 09/18/2019     Past Medical History:  Diagnosis Date   Anxiety    Osteopenia    RLS (restless legs syndrome)    Social History   Socioeconomic History   Marital status: Married    Spouse name: Not on file   Number of children: Not on file   Years of education: Not on file   Highest education level: Not on file  Occupational History   Not on file  Tobacco Use   Smoking status: Never   Smokeless tobacco: Never  Vaping Use   Vaping Use: Never used  Substance and Sexual Activity   Alcohol use: No   Drug use: No   Sexual activity: Not on file  Other Topics Concern   Not on file  Social History Narrative   Not on file   Social Determinants of Health   Financial Resource Strain: Not on file  Food Insecurity: Not on file  Transportation Needs: Not on file  Physical Activity: Not on file  Stress: Not on file  Social Connections: Not on file  Intimate Partner Violence: Not on file   Past Surgical History:  Procedure Laterality Date   ABDOMINAL HYSTERECTOMY     CHOLECYSTECTOMY     FOOT NEUROMA SURGERY Right 09/2014    SINUS IRRIGATION  2016   STRABISMUS SURGERY Right 05/17/2020   Procedure: REPAIR STRABISMUS RIGHT EYE;  Surgeon: Lamonte Sakai, MD;  Location: [facility];  Service: Ophthalmology;  Laterality: Right;   TUBAL LIGATION     Family History  Problem Relation Age of Onset   Emphysema Mother        smoked   Hip fracture Mother    Colon cancer Father    Hypertension Sister    Hypertension Brother     Current Outpatient Medications:    acetaminophen (TYLENOL) 500 MG tablet, Take 1 tablet (500 mg total) by mouth every 6 (six) hours as needed., Disp: 30 tablet, Rfl: 0   Black Cohosh 160 MG CAPS, black cohosh, Disp: , Rfl:    buPROPion (WELLBUTRIN SR) 150 MG 12 hr tablet, TAKE 1 TABLET BY MOUTH EVERY DAY, Disp: 90 tablet, Rfl: 0   risedronate (ACTONEL) 150 MG tablet, Take 1 tablet (150 mg total) by mouth every 30 (thirty) days. with water on empty stomach, nothing by mouth or lie down for next 30 minutes., Disp: 3 tablet, Rfl: 4   sertraline (ZOLOFT) 100 MG tablet, TAKE 1 TABLET BY MOUTH EVERY DAY, Disp: 90 tablet, Rfl: 0  No Known Allergies   ROS: Review of Systems A comprehensive review of systems was negative except for: Eyes: positive for irritation Ears, nose, mouth, throat, and face: positive for hearing loss Integument/breast: positive for skin lesion(s)    Physical exam BP  121/74   Pulse 68   Temp 97.9 F (36.6 C)   Ht _0  (1.651 m)   Wt 176 lb 6.4 oz (80 kg)   SpO2 98%   BMI 29.35 kg/m  General appearance: alert, cooperative, appears stated age, and no distress Head: Normocephalic, without obvious abnormality, atraumatic Eyes: negative findings: lids and lashes normal, conjunctivae and sclerae normal, corneas clear, and pupils equal, round, reactive to light and accomodation Ears: normal TM's and external ear canals both ears Nose: Nares normal. Septum midline. Mucosa normal. No drainage or sinus tenderness. Throat: lips, mucosa, and tongue normal; teeth and  gums normal Neck: no adenopathy, supple, symmetrical, trachea midline, and thyroid not enlarged, symmetric, no tenderness/mass/nodules Back: symmetric, no curvature. ROM normal. No CVA tenderness. Lungs: clear to auscultation bilaterally Heart: regular rate and rhythm, S1, S2 normal, no murmur, click, rub or gallop Abdomen: soft, non-tender; bowel sounds normal; no masses,  no organomegaly Extremities: extremities normal, atraumatic, no cyanosis or edema Pulses: 2+ and symmetric Skin:  Several pigmented nevi.  She has varicose veins along bilateral ankles Lymph nodes: Cervical, supraclavicular, and axillary nodes normal. Neurologic: Grossly normal Psych: Mood stable, speech normal, affect appropriate     04/23/2022   11:26 AM 10/04/2021   10:50 AM 05/14/2021    3:20 PM  Depression screen PHQ 2/9  Decreased Interest 0 1 2  Down, Depressed, Hopeless 0 0 2  PHQ - 2 Score 0 1 4  Altered sleeping  1 3  Tired, decreased energy  3 3  Change in appetite  0 3  Feeling bad or failure about yourself   0 3  Trouble concentrating  0 3  Moving slowly or fidgety/restless  0 3  Suicidal thoughts  0 3  PHQ-9 Score  5 25  Difficult doing work/chores  Not difficult at all Very difficult      04/23/2022   11:26 AM 10/04/2021   10:53 AM 05/14/2021    3:20 PM  GAD 7 : Generalized Anxiety Score  Nervous, Anxious, on Edge 0 1 2  Control/stop worrying 0 0 2  Worry too much - different things 0 2 3  Trouble relaxing 0 2 3  Restless 0 0 2  Easily annoyed or irritable 0 1 3  Afraid - awful might happen 0 0 3  Total GAD 7 Score 0 6 18  Anxiety Difficulty Not difficult at all Somewhat difficult Somewhat difficult    Assessment/ Plan: Susan Whitney here for annual physical exam.   Annual physical exam  Generalized anxiety disorder - Plan: sertraline (ZOLOFT) 100 MG tablet  Moderate episode of recurrent major depressive disorder ([place]) - Plan: buPROPion (WELLBUTRIN SR) 150 MG 12 hr tablet,  sertraline (ZOLOFT) 100 MG tablet  Age-related osteoporosis without current pathological fracture - Plan: risedronate (ACTONEL) 150 MG tablet, CMP14+EGFR, VITAMIN D 25 Hydroxy (Vit-D Deficiency, Fractures), DG [facility] DEXA  Wears hearing aid in both ears  Vitamin D deficiency - Plan: VITAMIN D 25 Hydroxy (Vit-D Deficiency, Fractures)  Pure hypercholesterolemia - Plan: CMP14+EGFR, Lipid Panel, TSH  Iron deficiency - Plan: CBC, Iron, TIBC and Ferritin Panel  Up-to-date on preventative health care and will return for both vaccines in 2024.  Future orders have been placed for fasting labs.  Medications renewed.  DEXA scan reordered to follow-up osteoporosis after treatment with Actonel  Doing well with hearing aids.  Check vitamin D level, CMP, lipid TSH and iron levels given history of iron deficiency  Counseled on healthy lifestyle  choices, including diet (rich in fruits, vegetables and lean meats and low in salt and simple carbohydrates) and exercise (at least 30 minutes of moderate physical activity daily).  Patient to follow up in 1 year for annual exam or sooner if needed.  Doranne Schmutz M. Lajuana Ripple, DO

## 2022-06-03 NOTE — Patient Instructions (Signed)
Preventive Care 40-61 Years Old, Female Preventive care refers to lifestyle choices and visits with your health care provider that can promote health and wellness. Preventive care visits are also called wellness exams. What can I expect for my preventive care visit? Counseling Your health care provider may ask you questions about your: Medical history, including: Past medical problems. Family medical history. Pregnancy history. Current health, including: Menstrual cycle. Method of birth control. Emotional well-being. Home life and relationship well-being. Sexual activity and sexual health. Lifestyle, including: Alcohol, nicotine or tobacco, and drug use. Access to firearms. Diet, exercise, and sleep habits. Work and work environment. Sunscreen use. Safety issues such as seatbelt and bike helmet use. Physical exam Your health care provider will check your: Height and weight. These may be used to calculate your BMI (body mass index). BMI is a measurement that tells if you are at a healthy weight. Waist circumference. This measures the distance around your waistline. This measurement also tells if you are at a healthy weight and may help predict your risk of certain diseases, such as type 2 diabetes and high blood pressure. Heart rate and blood pressure. Body temperature. Skin for abnormal spots. What immunizations do I need?  Vaccines are usually given at various ages, according to a schedule. Your health care provider will recommend vaccines for you based on your age, medical history, and lifestyle or other factors, such as travel or where you work. What tests do I need? Screening Your health care provider may recommend screening tests for certain conditions. This may include: Lipid and cholesterol levels. Diabetes screening. This is done by checking your blood sugar (glucose) after you have not eaten for a while (fasting). Pelvic exam and Pap test. Hepatitis B test. Hepatitis C  test. HIV (human immunodeficiency virus) test. STI (sexually transmitted infection) testing, if you are at risk. Lung cancer screening. Colorectal cancer screening. Mammogram. Talk with your health care provider about when you should start having regular mammograms. This may depend on whether you have a family history of breast cancer. BRCA-related cancer screening. This may be done if you have a family history of breast, ovarian, tubal, or peritoneal cancers. Bone density scan. This is done to screen for osteoporosis. Talk with your health care provider about your test results, treatment options, and if necessary, the need for more tests. Follow these instructions at home: Eating and drinking  Eat a diet that includes fresh fruits and vegetables, whole grains, lean protein, and low-fat dairy products. Take vitamin and mineral supplements as recommended by your health care provider. Do not drink alcohol if: Your health care provider tells you not to drink. You are pregnant, may be pregnant, or are planning to become pregnant. If you drink alcohol: Limit how much you have to 0-1 drink a day. Know how much alcohol is in your drink. In the [place] one drink equals one 12 oz bottle of beer (355 mL), one 5 oz glass of wine (148 mL), or one 1 oz glass of hard liquor (44 mL). Lifestyle Brush your teeth every morning and night with fluoride toothpaste. Floss one time each day. Exercise for at least 30 minutes 5 or more days each week. Do not use any products that contain nicotine or tobacco. These products include cigarettes, chewing tobacco, and vaping devices, such as e-cigarettes. If you need help quitting, ask your health care provider. Do not use drugs. If you are sexually active, practice safe sex. Use a condom or other form of protection to   prevent STIs. If you do not wish to become pregnant, use a form of birth control. If you plan to become pregnant, see your health care provider for a  prepregnancy visit. Take aspirin only as told by your health care provider. Make sure that you understand how much to take and what form to take. Work with your health care provider to find out whether it is safe and beneficial for you to take aspirin daily. Find healthy ways to manage stress, such as: Meditation, yoga, or listening to music. Journaling. Talking to a trusted person. Spending time with friends and family. Minimize exposure to UV radiation to reduce your risk of skin cancer. Safety Always wear your seat belt while driving or riding in a vehicle. Do not drive: If you have been drinking alcohol. Do not ride with someone who has been drinking. When you are tired or distracted. While texting. If you have been using any mind-altering substances or drugs. Wear a helmet and other protective equipment during sports activities. If you have firearms in your house, make sure you follow all gun safety procedures. Seek help if you have been physically or sexually abused. What's next? Visit your health care provider once a year for an annual wellness visit. Ask your health care provider how often you should have your eyes and teeth checked. Stay up to date on all vaccines. This information is not intended to replace advice given to you by your health care provider. Make sure you discuss any questions you have with your health care provider. Document Revised: 01/02/2021 Document Reviewed: 01/02/2021 Elsevier Patient Education  [place].

## 2022-06-24 ENCOUNTER — Encounter: Payer: Self-pay | Admitting: Family Medicine

## 2022-06-24 ENCOUNTER — Ambulatory Visit: Payer: BC Managed Care – PPO | Admitting: Family Medicine

## 2022-06-24 VITALS — BP 118/74 | HR 75 | Temp 98.4°F | Ht 65.0 in | Wt 176.4 lb

## 2022-06-24 DIAGNOSIS — Z4802 Encounter for removal of sutures: Secondary | ICD-10-CM | POA: Diagnosis not present

## 2022-06-24 NOTE — Progress Notes (Signed)
Subjective: YY:TKPTWS removal PCP: Janora Norlander, DO HPI:Tyteanna A Biebel is a 61 y.o. female presenting to clinic today for:  1.  Here for suture removal She had 2 lesions excised by her dermatologist a couple of weeks ago, 1 on her left shoulder and 1 on her right inner thigh.  The right inner thigh was a benign fatty tumor and the 1 on her left shoulder was a slightly dysplastic lesion.  However, they felt comfortable with her having her sutures removed here so she did not have to make another trip down to [place] as no additional biopsy was needed.  She notes that the 1 on her shoulder does get caught on her bra sometimes and that causes some bleeding and irritation but the area in the right thigh has been doing well.  No purulence, fevers or tenderness reported.   ROS: Per HPI  No Known Allergies Past Medical History:  Diagnosis Date   Anxiety    Osteopenia    RLS (restless legs syndrome)     Current Outpatient Medications:    acetaminophen (TYLENOL) 500 MG tablet, Take 1 tablet (500 mg total) by mouth every 6 (six) hours as needed., Disp: 30 tablet, Rfl: 0   Black Cohosh 160 MG CAPS, black cohosh, Disp: , Rfl:    buPROPion (WELLBUTRIN SR) 150 MG 12 hr tablet, Take 1 tablet (150 mg total) by mouth daily., Disp: 90 tablet, Rfl: 3   risedronate (ACTONEL) 150 MG tablet, Take 1 tablet (150 mg total) by mouth every 30 (thirty) days. with water on empty stomach, nothing by mouth or lie down for next 30 minutes., Disp: 3 tablet, Rfl: 4   sertraline (ZOLOFT) 100 MG tablet, Take 1 tablet (100 mg total) by mouth daily., Disp: 90 tablet, Rfl: 3 Social History   Socioeconomic History   Marital status: Married    Spouse name: Not on file   Number of children: Not on file   Years of education: Not on file   Highest education level: Not on file  Occupational History   Not on file  Tobacco Use   Smoking status: Never   Smokeless tobacco: Never  Vaping Use   Vaping Use: Never used   Substance and Sexual Activity   Alcohol use: No   Drug use: No   Sexual activity: Not on file  Other Topics Concern   Not on file  Social History Narrative   Married   Ship broker   Social Determinants of Health   Financial Resource Strain: Not on file  Food Insecurity: Not on file  Transportation Needs: Not on file  Physical Activity: Not on file  Stress: Not on file  Social Connections: Not on file  Intimate Partner Violence: Not on file   Family History  Problem Relation Age of Onset   Emphysema Mother        smoked   Hip fracture Mother    Colon cancer Father    Hypertension Sister    Breast cancer Sister    Hypertension Brother     Objective: Office vital signs reviewed. BP 118/74   Pulse 75   Temp 98.4 F (36.9 C)   Ht '5\' 5"'$  (1.651 m)   Wt 176 lb 6.4 oz (80 kg)   SpO2 96%   BMI 29.35 kg/m   Physical Examination:  General: Awake, alert, well nourished, No acute distress Left shoulder: 3 interrupted stitches appreciated.  Mild erythema noted at the suture sites but  no exudates.  After removal of the sutures she had mild bleeding at the second suture site that was easily hemostatic with pressure in the Band-Aid Right thigh: 3 interrupted stitches present.  Mild erythema noted but no exudates.  Assessment/ Plan: 61 y.o. female   Visit for suture removal  Sutures were removed easily without any complications.  Home care instructions reviewed and reasons for reevaluation discussed.  Follow-up as needed  No orders of the defined types were placed in this encounter.  No orders of the defined types were placed in this encounter.    Janora Norlander, DO [place] [phone]

## 2022-09-05 ENCOUNTER — Ambulatory Visit (INDEPENDENT_AMBULATORY_CARE_PROVIDER_SITE_OTHER): Payer: BC Managed Care – PPO

## 2022-09-05 ENCOUNTER — Ambulatory Visit (HOSPITAL_COMMUNITY)
Admission: RE | Admit: 2022-09-05 | Discharge: 2022-09-05 | Disposition: A | Discharge status: Home / Self Care | Payer: BC Managed Care – PPO | Source: Ambulatory Visit | Attending: Family Medicine | Admitting: Family Medicine

## 2022-09-05 ENCOUNTER — Encounter: Payer: Self-pay | Admitting: Family Medicine

## 2022-09-05 ENCOUNTER — Ambulatory Visit: Payer: BC Managed Care – PPO | Admitting: Family Medicine

## 2022-09-05 VITALS — BP 121/79 | HR 76 | Ht 65.0 in

## 2022-09-05 DIAGNOSIS — K56609 Unspecified intestinal obstruction, unspecified as to partial versus complete obstruction: Secondary | ICD-10-CM | POA: Diagnosis present

## 2022-09-05 DIAGNOSIS — R1084 Generalized abdominal pain: Secondary | ICD-10-CM

## 2022-09-05 MED ORDER — IOHEXOL 300 MG/ML  SOLN
100.0000 mL | Freq: Once | INTRAMUSCULAR | Status: AC | PRN
  Administered 2022-09-05: 100 mL via INTRAVENOUS

## 2022-09-05 MED ORDER — IOHEXOL 9 MG/ML PO SOLN
ORAL | Status: AC
  Filled 2022-09-05: qty 1000

## 2022-09-05 NOTE — Progress Notes (Signed)
BP 121/79   Pulse 76   Ht 5' 5"$  (1.651 m)   SpO2 97%   BMI 29.35 kg/m    Subjective:   Patient ID: [name], female    DOB: 1960/09/02, 62 y.o.   MRN: [id number]  HPI: NIJA WENDLAND is a 62 y.o. female presenting on 09/05/2022 for Abdominal Pain ([place] ? constipatioin) and Nausea   HPI Abdominal pain and nausea Patient comes in complaining initially having abnormal stools that were thinner and changed in shape.  She says this started about a week and a half ago and then gradually over the past week and a half she started developing some increased gas and belching and burping.  She does not necessarily say that she has to strain to have a bowel movement but she has been having smaller pieces of bowel movement and is not a full bowel movement.  She denies any fevers or chills or blood in her stool.  She has not vomited but does feel very nauseous over the past 4 to 5 days.  She has seen gastroenterology and had a colonoscopy about 5 years ago and did not have any issues on it.  Relevant past medical, surgical, family and social history reviewed and updated as indicated. Interim medical history since our last visit reviewed. Allergies and medications reviewed and updated.  Review of Systems  Constitutional:  Negative for chills and fever.  Eyes:  Negative for visual disturbance.  Respiratory:  Negative for chest tightness and shortness of breath.   Cardiovascular:  Negative for chest pain and leg swelling.  Gastrointestinal:  Positive for abdominal pain and nausea. Negative for constipation, diarrhea and vomiting.  Musculoskeletal:  Negative for back pain and gait problem.  Skin:  Negative for rash.  Neurological:  Negative for dizziness, light-headedness and headaches.  Psychiatric/Behavioral:  Negative for agitation and behavioral problems.   All other systems reviewed and are negative.   Per HPI unless specifically indicated above   Allergies as of 09/05/2022   No Known  Allergies      Medication List        Accurate as of September 05, 2022  9:38 AM. If you have any questions, ask your nurse or doctor.          acetaminophen 500 MG tablet Commonly known as: TYLENOL Take 1 tablet (500 mg total) by mouth every 6 (six) hours as needed.   Black Cohosh 160 MG Caps black cohosh   buPROPion 150 MG 12 hr tablet Commonly known as: WELLBUTRIN SR Take 1 tablet (150 mg total) by mouth daily.   risedronate 150 MG tablet Commonly known as: Actonel Take 1 tablet (150 mg total) by mouth every 30 (thirty) days. with water on empty stomach, nothing by mouth or lie down for next 30 minutes.   sertraline 100 MG tablet Commonly known as: ZOLOFT Take 1 tablet (100 mg total) by mouth daily.         Objective:   BP 121/79   Pulse 76   Ht 5' 5"$  (1.651 m)   SpO2 97%   BMI 29.35 kg/m   Wt Readings from Last 3 Encounters:  06/24/22 176 lb 6.4 oz (80 kg)  06/03/22 176 lb 6.4 oz (80 kg)  04/23/22 174 lb 9.6 oz (79.2 kg)    Physical Exam Vitals and nursing note reviewed.  Constitutional:      General: She is not in acute distress.    Appearance: She is well-developed. She is  not diaphoretic.  Eyes:     Conjunctiva/sclera: Conjunctivae normal.  Cardiovascular:     Rate and Rhythm: Normal rate and regular rhythm.     Heart sounds: Normal heart sounds. No murmur heard. Pulmonary:     Effort: Pulmonary effort is normal. No respiratory distress.     Breath sounds: Normal breath sounds. No wheezing.  Abdominal:     General: Abdomen is flat. There is no distension.     Palpations: Abdomen is soft.     Tenderness: There is abdominal tenderness in the right lower quadrant, epigastric area and left lower quadrant. There is no right CVA tenderness or left CVA tenderness. Negative signs include Murphy's sign.     Hernia: No hernia is present.  Musculoskeletal:        General: No swelling. Normal range of motion.  Skin:    General: Skin is warm and dry.      Findings: No rash.  Neurological:     Mental Status: She is alert and oriented to person, place, and time.     Coordination: Coordination normal.  Psychiatric:        Behavior: Behavior normal.    KUB: Abnormal bowel gas pattern, distended in upper abdomen with gas and stool on both right colon and left colon.  Concern for possible obstruction.  Await final read from radiologist   Assessment & Plan:   Problem List Items Addressed This Visit   None Visit Diagnoses     Generalized abdominal pain    -  Primary   Relevant Orders   DG Abd 1 View   Intestinal obstruction, unspecified cause, unspecified whether partial or complete ([place])       Relevant Orders   CT Abdomen Pelvis W Contrast       Will order CT abdomen because of possible concern for obstruction and pain. Follow up plan: Return if symptoms worsen or fail to improve.  Counseling provided for all of the vaccine components Orders Placed This Encounter  Procedures   DG Abd 1 View   CT Abdomen Pelvis [place] Khye Hochstetler, MD [facility] 09/05/2022, 9:38 AM

## 2022-09-25 ENCOUNTER — Telehealth: Payer: Self-pay | Admitting: Family Medicine

## 2022-09-25 DIAGNOSIS — K56609 Unspecified intestinal obstruction, unspecified as to partial versus complete obstruction: Secondary | ICD-10-CM

## 2022-09-25 DIAGNOSIS — R1084 Generalized abdominal pain: Secondary | ICD-10-CM

## 2022-09-25 NOTE — Telephone Encounter (Signed)
Placed referral for the patient.

## 2022-09-25 NOTE — Telephone Encounter (Signed)
REFERRAL REQUEST Telephone Note  Have you been seen at our office for this problem? Yes 09-05-22 with [place] (Advise that they may need an appointment with their PCP before a referral can be done)  Reason for Referral: Stomach pain Referral discussed with patient: NO  Best contact number of patient for referral team: [phone]    Has patient been seen by a specialist for this issue before: NO  Patient provider preference for referral: Specialist seen in the past. Patient location preference for referral: [facility] on [facility]   Patient notified that referrals can take up to a week or longer to process. If they haven't heard anything within a week they should call back and speak with the referral department.

## 2022-11-24 ENCOUNTER — Other Ambulatory Visit: Payer: BC Managed Care – PPO

## 2022-11-25 ENCOUNTER — Ambulatory Visit: Payer: BC Managed Care – PPO

## 2022-11-25 ENCOUNTER — Other Ambulatory Visit: Payer: Self-pay | Admitting: Family Medicine

## 2022-11-25 DIAGNOSIS — H903 Sensorineural hearing loss, bilateral: Secondary | ICD-10-CM

## 2022-11-25 DIAGNOSIS — Z780 Asymptomatic menopausal state: Secondary | ICD-10-CM

## 2022-11-26 ENCOUNTER — Other Ambulatory Visit (INDEPENDENT_AMBULATORY_CARE_PROVIDER_SITE_OTHER): Payer: BC Managed Care – PPO

## 2022-11-26 DIAGNOSIS — Z780 Asymptomatic menopausal state: Secondary | ICD-10-CM

## 2022-12-01 ENCOUNTER — Encounter: Payer: Self-pay | Admitting: Family Medicine

## 2022-12-02 ENCOUNTER — Other Ambulatory Visit: Payer: BC Managed Care – PPO

## 2022-12-02 DIAGNOSIS — M810 Age-related osteoporosis without current pathological fracture: Secondary | ICD-10-CM

## 2022-12-02 DIAGNOSIS — E559 Vitamin D deficiency, unspecified: Secondary | ICD-10-CM

## 2022-12-02 DIAGNOSIS — E611 Iron deficiency: Secondary | ICD-10-CM

## 2022-12-02 DIAGNOSIS — E7800 Pure hypercholesterolemia, unspecified: Secondary | ICD-10-CM

## 2022-12-03 ENCOUNTER — Other Ambulatory Visit: Payer: Self-pay | Admitting: Family Medicine

## 2022-12-03 DIAGNOSIS — E559 Vitamin D deficiency, unspecified: Secondary | ICD-10-CM

## 2022-12-03 DIAGNOSIS — M810 Age-related osteoporosis without current pathological fracture: Secondary | ICD-10-CM

## 2022-12-03 LAB — CMP14+EGFR
ALT: 8 IU/L (ref 0–32)
AST: 15 IU/L (ref 0–40)
Albumin/Globulin Ratio: 1.7 (ref 1.2–2.2)
Albumin: 4.6 g/dL (ref 3.9–4.9)
Alkaline Phosphatase: 107 IU/L (ref 44–121)
BUN/Creatinine Ratio: 14 (ref 12–28)
BUN: 10 mg/dL (ref 8–27)
Bilirubin Total: 0.3 mg/dL (ref 0.0–1.2)
CO2: 22 mmol/L (ref 20–29)
Calcium: 9.8 mg/dL (ref 8.7–10.3)
Chloride: 102 mmol/L (ref 96–106)
Creatinine, Ser: 0.69 mg/dL (ref 0.57–1.00)
Globulin, Total: 2.7 g/dL (ref 1.5–4.5)
Glucose: 95 mg/dL (ref 70–99)
Potassium: 4.5 mmol/L (ref 3.5–5.2)
Sodium: 139 mmol/L (ref 134–144)
Total Protein: 7.3 g/dL (ref 6.0–8.5)
eGFR: 99 mL/min/{1.73_m2} (ref >59)

## 2022-12-03 LAB — VITAMIN D 25 HYDROXY (VIT D DEFICIENCY, FRACTURES): Vit D, 25-Hydroxy: 19 ng/mL — ABNORMAL LOW (ref 30.0–100.0)

## 2022-12-03 LAB — CBC
Hematocrit: 43.4 % (ref 34.0–46.6)
Hemoglobin: 13.6 g/dL (ref 11.1–15.9)
MCH: 25.5 pg — ABNORMAL LOW (ref 26.6–33.0)
MCHC: 31.3 g/dL — ABNORMAL LOW (ref 31.5–35.7)
MCV: 81 fL (ref 79–97)
Platelets: 381 10*3/uL (ref 150–450)
RBC: 5.33 x10E6/uL — ABNORMAL HIGH (ref 3.77–5.28)
RDW: 15.9 % — ABNORMAL HIGH (ref 11.7–15.4)
WBC: 8.3 10*3/uL (ref 3.4–10.8)

## 2022-12-03 LAB — IRON,TIBC AND FERRITIN PANEL
Ferritin: 18 ng/mL (ref 15–150)
Iron Saturation: 19 % (ref 15–55)
Iron: 65 ug/dL (ref 27–139)
Total Iron Binding Capacity: 342 ug/dL (ref 250–450)
UIBC: 277 ug/dL (ref 118–369)

## 2022-12-03 LAB — LIPID PANEL
Chol/HDL Ratio: 3.3 ratio (ref 0.0–4.4)
Cholesterol, Total: 183 mg/dL (ref 100–199)
HDL: 56 mg/dL (ref >39)
LDL Chol Calc (NIH): 111 mg/dL — ABNORMAL HIGH (ref 0–99)
Triglycerides: 89 mg/dL (ref 0–149)
VLDL Cholesterol Cal: 16 mg/dL (ref 5–40)

## 2022-12-03 LAB — TSH: TSH: 2.22 u[IU]/mL (ref 0.450–4.500)

## 2022-12-03 MED ORDER — VITAMIN D (ERGOCALCIFEROL) 1.25 MG (50000 UNIT) PO CAPS: 50000.0000 [IU] | ORAL_CAPSULE | ORAL | 3 refills | Status: DC

## 2022-12-12 ENCOUNTER — Ambulatory Visit: Payer: BC Managed Care – PPO | Admitting: Family Medicine

## 2022-12-12 ENCOUNTER — Encounter: Payer: Self-pay | Admitting: Family Medicine

## 2022-12-12 VITALS — BP 127/78 | HR 87 | Temp 98.2°F | Ht 65.0 in | Wt 180.0 lb

## 2022-12-12 DIAGNOSIS — J069 Acute upper respiratory infection, unspecified: Secondary | ICD-10-CM | POA: Diagnosis not present

## 2022-12-12 MED ORDER — AMOXICILLIN-POT CLAVULANATE 875-125 MG PO TABS: 1.0000 | ORAL_TABLET | Freq: Two times a day (BID) | ORAL | 0 refills | Status: DC

## 2022-12-12 MED ORDER — LEVOCETIRIZINE DIHYDROCHLORIDE 5 MG PO TABS: 5.0000 mg | ORAL_TABLET | Freq: Every evening | ORAL | 99 refills | Status: DC | PRN

## 2022-12-12 MED ORDER — FLUTICASONE PROPIONATE 50 MCG/ACT NA SUSP: 2.0000 | Freq: Every day | NASAL | 6 refills | Status: DC

## 2022-12-12 NOTE — Progress Notes (Signed)
Subjective: CC: URI PCP: Raliegh Ip, DO HPI:Susan Whitney is a 62 y.o. female presenting to clinic today for:  1.  URI Patient reports that she started feeling sick about 3 days ago.  She reports sinus pressure, drainage, postnasal drip and cough.  No hemoptysis, no brown sputum.  No fevers.  No shortness of breath.  No wheezing.  Was exposed to sick coworker with similar symptoms.  Has been using DayQuil and NyQuil but symptoms are persistent.  No purulence from nares   ROS: Per HPI  No Known Allergies Past Medical History:  Diagnosis Date   Anxiety    Osteopenia    RLS (restless legs syndrome)     Current Outpatient Medications:    acetaminophen (TYLENOL) 500 MG tablet, Take 1 tablet (500 mg total) by mouth every 6 (six) hours as needed., Disp: 30 tablet, Rfl: 0   Black Cohosh 160 MG CAPS, black cohosh, Disp: , Rfl:    buPROPion (WELLBUTRIN SR) 150 MG 12 hr tablet, Take 1 tablet (150 mg total) by mouth daily., Disp: 90 tablet, Rfl: 3   risedronate (ACTONEL) 150 MG tablet, Take 1 tablet (150 mg total) by mouth every 30 (thirty) days. with water on empty stomach, nothing by mouth or lie down for next 30 minutes., Disp: 3 tablet, Rfl: 4   sertraline (ZOLOFT) 100 MG tablet, Take 1 tablet (100 mg total) by mouth daily., Disp: 90 tablet, Rfl: 3   Vitamin D, Ergocalciferol, (DRISDOL) 1.25 MG (50000 UNIT) CAPS capsule, Take 1 capsule (50,000 Units total) by mouth every 7 (seven) days., Disp: 12 capsule, Rfl: 3 Social History   Socioeconomic History   Marital status: Married    Spouse name: Not on file   Number of children: Not on file   Years of education: Not on file   Highest education level: Not on file  Occupational History   Not on file  Tobacco Use   Smoking status: Never   Smokeless tobacco: Never  Vaping Use   Vaping Use: Never used  Substance and Sexual Activity   Alcohol use: No   Drug use: No   Sexual activity: Not on file  Other Topics Concern   Not on  file  Social History 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 BP 127/78   Pulse 87   Temp 98.2 F (36.8 C) (Temporal)   Ht 5\' 5"  (1.651 m)   Wt 180 lb (81.6 kg)   SpO2 95%   BMI 29.95 kg/m   Physical Examination:  General: Awake, alert, well nourished, tired appearing HEENT: Normal    Neck: No masses palpated. No lymphadenopathy    Ears: Tympanic membranes intact, normal light reflex, no erythema, no bulging    Eyes: PERRLA, extraocular membranes intact, sclera white    Nose: nasal turbinates moist, clear nasal discharge  Throat: moist mucus membranes, mild erythema with cobblestone appearance of the oropharynx, no tonsillar exudate.  Airway is patent Cardio: regular rate and rhythm, S1S2 heard, no murmurs appreciated Pulm: clear to auscultation bilaterally, no wheezes, rhonchi or rales; normal work of breathing on room air    Assessment/ Plan: 62 y.o. female   URI with cough and congestion - Plan: levocetirizine (XYZAL) 5 MG tablet, fluticasone (FLONASE) 50 MCG/ACT nasal spray, amoxicillin-clavulanate (AUGMENTIN) 875-125 MG tablet  Suspect viral cold.  Home care structures reviewed with the patient.  Xyzal and Flonase sent to pharmacy.  Okay to use Afrin for up to 3 days if needed.  Encouraged use of immune- booster like airborne.  Delsym if needed for cough.  Pocket prescription for Augmentin sent given holiday weekend  No orders of the defined types  were placed in this encounter.  No orders of the defined types were placed in this encounter.    Raliegh Ip, DO [facility] [phone]

## 2022-12-22 ENCOUNTER — Encounter: Payer: Self-pay | Admitting: Family Medicine

## 2022-12-22 ENCOUNTER — Ambulatory Visit: Payer: BC Managed Care – PPO | Admitting: Family Medicine

## 2022-12-22 VITALS — BP 120/70 | HR 71 | Temp 98.6°F | Ht 65.0 in | Wt 175.0 lb

## 2022-12-22 DIAGNOSIS — J9801 Acute bronchospasm: Secondary | ICD-10-CM | POA: Diagnosis not present

## 2022-12-22 DIAGNOSIS — Z23 Encounter for immunization: Secondary | ICD-10-CM

## 2022-12-22 DIAGNOSIS — M810 Age-related osteoporosis without current pathological fracture: Secondary | ICD-10-CM | POA: Diagnosis not present

## 2022-12-22 MED ORDER — AIRSUPRA 90-80 MCG/ACT IN AERO: 2.0000 | INHALATION_SPRAY | Freq: Four times a day (QID) | RESPIRATORY_TRACT | 0 refills | Status: DC | PRN

## 2022-12-22 MED ORDER — METHYLPREDNISOLONE ACETATE 40 MG/ML IJ SUSP
40.0000 mg | Freq: Once | INTRAMUSCULAR | Status: AC
  Administered 2022-12-22: 40 mg via INTRAMUSCULAR

## 2022-12-22 NOTE — Progress Notes (Signed)
Subjective: CC: Follow-up osteoporosis PCP: Raliegh Ip, DO HPI:Susan Whitney is a 62 y.o. female presenting to clinic today for:  1.  Osteoporosis DEXA scan performed in May which showed 9% improvement in bone density.  She has been on some type of bisphosphonate since at least 2020.  She is compliant with calcium and prescription vitamin D.  No reports of bony pain.  2.  Cough Patient was recently treated for URI and did start the Augmentin for presumed secondary bacterial infection.  She reports no hemoptysis, fevers or shortness of breath but notes that she has a fullness in her upper lungs with associated irritation and she simply just cannot get the mucus out despite use of Mucinex.  Has an EGD coming up in a couple of days and wants to ensure that she is okay to proceed.   ROS: Per HPI  No Known Allergies Past Medical History:  Diagnosis Date   Anxiety    Osteopenia    RLS (restless legs syndrome)     Current Outpatient Medications:    acetaminophen (TYLENOL) 500 MG tablet, Take 1 tablet (500 mg total) by mouth every 6 (six) hours as needed., Disp: 30 tablet, Rfl: 0   amoxicillin-clavulanate (AUGMENTIN) 875-125 MG tablet, Take 1 tablet by mouth 2 (two) times daily. Use if worsening/ develops bacterial symptoms, Disp: 20 tablet, Rfl: 0   Black Cohosh 160 MG CAPS, black cohosh, Disp: , Rfl:    buPROPion (WELLBUTRIN SR) 150 MG 12 hr tablet, Take 1 tablet (150 mg total) by mouth daily., Disp: 90 tablet, Rfl: 3   fluticasone (FLONASE) 50 MCG/ACT nasal spray, Place 2 sprays into both nostrils daily., Disp: 16 g, Rfl: 6   levocetirizine (XYZAL) 5 MG tablet, Take 1 tablet (5 mg total) by mouth at bedtime as needed for allergies (or drainage)., Disp: 30 tablet, Rfl: PRN   risedronate (ACTONEL) 150 MG tablet, Take 1 tablet (150 mg total) by mouth every 30 (thirty) days. with water on empty stomach, nothing by mouth or lie down for next 30 minutes., Disp: 3 tablet, Rfl: 4    sertraline (ZOLOFT) 100 MG tablet, Take 1 tablet (100 mg total) by mouth daily., Disp: 90 tablet, Rfl: 3   Vitamin D, Ergocalciferol, (DRISDOL) 1.25 MG (50000 UNIT) CAPS capsule, Take 1 capsule (50,000 Units total) by mouth every 7 (seven) days., Disp: 12 capsule, Rfl: 3 Social History   Socioeconomic History   Marital status: Married    Spouse name: Not on file   Number of children: Not on file   Years of education: Not on file   Highest education level: 12th grade  Occupational History   Not on file  Tobacco Use   Smoking status: Never   Smokeless tobacco: Never  Vaping Use   Vaping Use: Never used  Substance and Sexual Activity   Alcohol use: No   Drug use: No   Sexual activity: Not on file  Other Topics Concern   Not on file  Social History 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   Financial Resource Strain: Low Risk  (12/19/2022)   Overall Financial Resource Strain (CARDIA)    Difficulty of Paying Living Expenses: Not hard at all  Food Insecurity: No Food Insecurity (12/19/2022)   Hunger Vital Sign    Worried About Running Out of Food in the Last Year: Never true    Ran Out of Food in the Last Year:  Never true  Transportation Needs: No Transportation Needs (12/19/2022)   PRAPARE - Administrator, Civil Service (Medical): No    Lack of Transportation (Non-Medical): No  Physical Activity: Insufficiently Active (12/19/2022)   Exercise Vital Sign    Days of Exercise per Week: 1 day    Minutes of Exercise per Session: 10 min  Stress: Stress Concern Present (12/19/2022)   [employer] of Occupational Health - Occupational Stress Questionnaire    Feeling of Stress : To some extent  Social Connections: Socially Integrated (12/19/2022)   Social Connection and Isolation Panel [NHANES]    Frequency of Communication with Friends and Family: More than three times a week    Frequency of Social Gatherings with Friends and  Family: Twice a week    Attends Religious Services: More than 4 times per year    Active Member of [employer] or Organizations: Yes    Attends Banker Meetings: 1 to 4 times per year    Marital Status: Married  Catering manager Violence: Not on file   Family History  Problem Relation Age of Onset   Emphysema Mother        smoked   Hip fracture Mother    Colon cancer Father    Hypertension Sister    Breast cancer Sister    Hypertension Brother     Objective: Office vital signs reviewed. BP 120/70   Pulse 71   Temp 98.6 F (37 C)   Ht 5\' 5"  (1.651 m)   Wt 175 lb (79.4 kg)   SpO2 93%   BMI 29.12 kg/m   Physical Examination:  General: Awake, alert, well nourished, No acute distress HEENT: Normal    Neck: No masses palpated. No lymphadenopathy    Ears: Tympanic membranes intact, normal light reflex, no erythema, no bulging    Eyes: PERRLA, extraocular membranes intact, sclera white    Nose: nasal turbinates moist, no nasal discharge    Throat: moist mucus membranes, mild oropharyngeal erythema, no tonsillar exudate.  Airway is patent Cardio: regular rate and rhythm, S1S2 heard, no murmurs appreciated Pulm: Mild but global expiratory wheezes noted on exam that do not clear after coughing. Extremities: warm, well perfused, No edema, cyanosis or clubbing; +2 pulses bilaterally MSK: Normal gait and station  Assessment/ Plan: 62 y.o. female   Bronchospasm, acute - Plan: methylPREDNISolone acetate (DEPO-MEDROL) injection 40 mg, Albuterol-Budesonide (AIRSUPRA) 90-80 MCG/ACT AERO  Age-related osteoporosis without current pathological fracture  Expiratory wheezing on exam.  Depo IM given. Sample of airsupra given.  Discussed rinsing mouth after each use.  Complete ABX as prescribed.  If any worsening, let me know.  We discussed consideration for transition over to Prolia.  She is due for drug holiday as she has been on bisphosphonates for almost 5 years now.  Okay to do  a couple months of vitamin D and calcium only and then resume Actonel for now.   Orders Placed This Encounter  Procedures   Zoster Recombinant (Shingrix )   No orders of the defined types were placed in this encounter.    Raliegh Ip, DO [facility] [phone]

## 2022-12-23 ENCOUNTER — Telehealth: Payer: Self-pay

## 2022-12-23 NOTE — Telephone Encounter (Signed)
Yes, this is a localized site reaction and is very common with this injection.  Ice to affected are.  She can take NSAIDs and apply topical benadryl if she wants but it usually will resolve on its own after a couple days.

## 2022-12-23 NOTE — Telephone Encounter (Signed)
Pt got her first shingles shot yesterday 06/03 in her left arm - pt presents a raised red bump around and slightly above where she got the injection   States it does not hurt , it does itch and is raised no other symptoms   Wants to know if this is normal , or what she can do   She has not taken any benadryl or put anything on it

## 2022-12-23 NOTE — Telephone Encounter (Signed)
Pt has been notified.

## 2022-12-24 LAB — HM COLONOSCOPY

## 2023-01-16 ENCOUNTER — Encounter: Payer: Self-pay | Admitting: Family Medicine

## 2023-02-16 ENCOUNTER — Other Ambulatory Visit: Payer: Self-pay | Admitting: Oncology

## 2023-02-16 DIAGNOSIS — Z006 Encounter for examination for normal comparison and control in clinical research program: Secondary | ICD-10-CM

## 2023-02-19 ENCOUNTER — Other Ambulatory Visit (HOSPITAL_COMMUNITY)
Admission: RE | Admit: 2023-02-19 | Discharge: 2023-02-19 | Disposition: A | Discharge status: Home / Self Care | Payer: BC Managed Care – PPO | Source: Ambulatory Visit | Attending: Oncology | Admitting: Oncology

## 2023-02-19 DIAGNOSIS — Z006 Encounter for examination for normal comparison and control in clinical research program: Secondary | ICD-10-CM | POA: Insufficient documentation

## 2023-03-20 ENCOUNTER — Encounter: Payer: Self-pay | Admitting: Family Medicine

## 2023-05-04 ENCOUNTER — Other Ambulatory Visit: Payer: 59

## 2023-05-04 ENCOUNTER — Encounter (INDEPENDENT_AMBULATORY_CARE_PROVIDER_SITE_OTHER): Payer: 59 | Admitting: Family Medicine

## 2023-05-04 DIAGNOSIS — R5383 Other fatigue: Secondary | ICD-10-CM

## 2023-05-04 NOTE — Telephone Encounter (Signed)

## 2023-05-05 LAB — ANEMIA PROFILE B
Basophils Absolute: 0.1 10*3/uL (ref 0.0–0.2)
Basos: 1 %
EOS (ABSOLUTE): 0.5 10*3/uL — ABNORMAL HIGH (ref 0.0–0.4)
Eos: 5 %
Ferritin: 10 ng/mL — ABNORMAL LOW (ref 15–150)
Folate: 7.1 ng/mL (ref >3.0)
Hematocrit: 39.5 % (ref 34.0–46.6)
Hemoglobin: 12.5 g/dL (ref 11.1–15.9)
Immature Grans (Abs): 0 10*3/uL (ref 0.0–0.1)
Immature Granulocytes: 0 %
Iron Saturation: 7 % — CL (ref 15–55)
Iron: 28 ug/dL (ref 27–139)
Lymphocytes Absolute: 2.2 10*3/uL (ref 0.7–3.1)
Lymphs: 20 %
MCH: 25.8 pg — ABNORMAL LOW (ref 26.6–33.0)
MCHC: 31.6 g/dL (ref 31.5–35.7)
MCV: 81 fL (ref 79–97)
Monocytes Absolute: 0.6 10*3/uL (ref 0.1–0.9)
Monocytes: 6 %
Neutrophils Absolute: 7.5 10*3/uL — ABNORMAL HIGH (ref 1.4–7.0)
Neutrophils: 68 %
Platelets: 406 10*3/uL (ref 150–450)
RBC: 4.85 x10E6/uL (ref 3.77–5.28)
RDW: 14.4 % (ref 11.7–15.4)
Retic Ct Pct: 1 % (ref 0.6–2.6)
Total Iron Binding Capacity: 376 ug/dL (ref 250–450)
UIBC: 348 ug/dL (ref 118–369)
Vitamin B-12: 263 pg/mL (ref 232–1245)
WBC: 11.1 10*3/uL — ABNORMAL HIGH (ref 3.4–10.8)

## 2023-05-05 LAB — CMP14+EGFR
ALT: 6 [IU]/L (ref 0–32)
AST: 14 [IU]/L (ref 0–40)
Albumin: 4.6 g/dL (ref 3.9–4.9)
Alkaline Phosphatase: 116 [IU]/L (ref 44–121)
BUN/Creatinine Ratio: 13 (ref 12–28)
BUN: 11 mg/dL (ref 8–27)
Bilirubin Total: 0.2 mg/dL (ref 0.0–1.2)
CO2: 25 mmol/L (ref 20–29)
Calcium: 9.7 mg/dL (ref 8.7–10.3)
Chloride: 102 mmol/L (ref 96–106)
Creatinine, Ser: 0.85 mg/dL (ref 0.57–1.00)
Globulin, Total: 2.7 g/dL (ref 1.5–4.5)
Glucose: 130 mg/dL — ABNORMAL HIGH (ref 70–99)
Potassium: 4.8 mmol/L (ref 3.5–5.2)
Sodium: 141 mmol/L (ref 134–144)
Total Protein: 7.3 g/dL (ref 6.0–8.5)
eGFR: 77 mL/min/{1.73_m2} (ref >59)

## 2023-05-05 LAB — TSH: TSH: 1.32 u[IU]/mL (ref 0.450–4.500)

## 2023-05-05 LAB — T4, FREE: Free T4: 0.97 ng/dL (ref 0.82–1.77)

## 2023-06-02 ENCOUNTER — Other Ambulatory Visit: Payer: Self-pay | Admitting: *Deleted

## 2023-06-02 DIAGNOSIS — M810 Age-related osteoporosis without current pathological fracture: Secondary | ICD-10-CM

## 2023-06-02 DIAGNOSIS — F331 Major depressive disorder, recurrent, moderate: Secondary | ICD-10-CM

## 2023-06-02 DIAGNOSIS — E559 Vitamin D deficiency, unspecified: Secondary | ICD-10-CM

## 2023-06-02 DIAGNOSIS — F411 Generalized anxiety disorder: Secondary | ICD-10-CM

## 2023-06-02 MED ORDER — SERTRALINE HCL 100 MG PO TABS: 100.0000 mg | ORAL_TABLET | Freq: Every day | ORAL | 0 refills | Status: DC

## 2023-06-02 MED ORDER — BUPROPION HCL ER (SR) 150 MG PO TB12: 150.0000 mg | ORAL_TABLET | Freq: Every day | ORAL | 0 refills | Status: DC

## 2023-06-02 MED ORDER — VITAMIN D (ERGOCALCIFEROL) 1.25 MG (50000 UNIT) PO CAPS: 50000.0000 [IU] | ORAL_CAPSULE | ORAL | 0 refills | Status: DC

## 2023-06-04 ENCOUNTER — Other Ambulatory Visit: Payer: Self-pay | Admitting: Family Medicine

## 2023-06-04 DIAGNOSIS — F331 Major depressive disorder, recurrent, moderate: Secondary | ICD-10-CM

## 2023-08-25 ENCOUNTER — Other Ambulatory Visit: Payer: Self-pay | Admitting: Family Medicine

## 2023-08-25 DIAGNOSIS — F331 Major depressive disorder, recurrent, moderate: Secondary | ICD-10-CM

## 2023-08-25 DIAGNOSIS — M810 Age-related osteoporosis without current pathological fracture: Secondary | ICD-10-CM

## 2023-08-25 DIAGNOSIS — F411 Generalized anxiety disorder: Secondary | ICD-10-CM

## 2023-08-25 DIAGNOSIS — E559 Vitamin D deficiency, unspecified: Secondary | ICD-10-CM

## 2023-11-18 ENCOUNTER — Encounter: Payer: Self-pay | Admitting: Family Medicine

## 2023-12-15 ENCOUNTER — Encounter: Payer: Self-pay | Admitting: Family Medicine

## 2023-12-16 ENCOUNTER — Other Ambulatory Visit: Payer: Self-pay | Admitting: Family Medicine

## 2023-12-16 ENCOUNTER — Encounter: Payer: Self-pay | Admitting: Family Medicine

## 2023-12-16 ENCOUNTER — Ambulatory Visit (INDEPENDENT_AMBULATORY_CARE_PROVIDER_SITE_OTHER): Payer: 59 | Admitting: Family Medicine

## 2023-12-16 VITALS — BP 109/75 | HR 80 | Temp 98.4°F | Ht 65.0 in | Wt 197.0 lb

## 2023-12-16 DIAGNOSIS — I83813 Varicose veins of bilateral lower extremities with pain: Secondary | ICD-10-CM | POA: Diagnosis not present

## 2023-12-16 DIAGNOSIS — F331 Major depressive disorder, recurrent, moderate: Secondary | ICD-10-CM | POA: Diagnosis not present

## 2023-12-16 DIAGNOSIS — E559 Vitamin D deficiency, unspecified: Secondary | ICD-10-CM | POA: Diagnosis not present

## 2023-12-16 DIAGNOSIS — M810 Age-related osteoporosis without current pathological fracture: Secondary | ICD-10-CM

## 2023-12-16 DIAGNOSIS — E7800 Pure hypercholesterolemia, unspecified: Secondary | ICD-10-CM | POA: Diagnosis not present

## 2023-12-16 DIAGNOSIS — J209 Acute bronchitis, unspecified: Secondary | ICD-10-CM

## 2023-12-16 DIAGNOSIS — Z23 Encounter for immunization: Secondary | ICD-10-CM | POA: Diagnosis not present

## 2023-12-16 DIAGNOSIS — J069 Acute upper respiratory infection, unspecified: Secondary | ICD-10-CM

## 2023-12-16 DIAGNOSIS — F411 Generalized anxiety disorder: Secondary | ICD-10-CM | POA: Diagnosis not present

## 2023-12-16 DIAGNOSIS — Z0000 Encounter for general adult medical examination without abnormal findings: Secondary | ICD-10-CM

## 2023-12-16 DIAGNOSIS — Z0001 Encounter for general adult medical examination with abnormal findings: Secondary | ICD-10-CM

## 2023-12-16 DIAGNOSIS — E611 Iron deficiency: Secondary | ICD-10-CM

## 2023-12-16 DIAGNOSIS — Z90711 Acquired absence of uterus with remaining cervical stump: Secondary | ICD-10-CM

## 2023-12-16 LAB — LIPID PANEL

## 2023-12-16 MED ORDER — VITAMIN D (ERGOCALCIFEROL) 1.25 MG (50000 UNIT) PO CAPS
50000.0000 [IU] | ORAL_CAPSULE | ORAL | 3 refills | Status: DC
Start: 2023-12-16 — End: 2024-02-22

## 2023-12-16 MED ORDER — RISEDRONATE SODIUM 150 MG PO TABS: 150.0000 mg | ORAL_TABLET | ORAL | 4 refills | Status: DC

## 2023-12-16 MED ORDER — LEVOCETIRIZINE DIHYDROCHLORIDE 5 MG PO TABS: 5.0000 mg | ORAL_TABLET | Freq: Every evening | ORAL | 3 refills | Status: AC | PRN

## 2023-12-16 MED ORDER — PREDNISONE 20 MG PO TABS: 40.0000 mg | ORAL_TABLET | Freq: Every day | ORAL | 0 refills | Status: AC

## 2023-12-16 MED ORDER — SERTRALINE HCL 100 MG PO TABS: 100.0000 mg | ORAL_TABLET | Freq: Every day | ORAL | 3 refills | Status: DC

## 2023-12-16 MED ORDER — BUPROPION HCL ER (SR) 150 MG PO TB12: 150.0000 mg | ORAL_TABLET | Freq: Every day | ORAL | 3 refills | Status: DC

## 2023-12-16 MED ORDER — ALBUTEROL SULFATE HFA 108 (90 BASE) MCG/ACT IN AERS: 2.0000 | INHALATION_SPRAY | Freq: Four times a day (QID) | RESPIRATORY_TRACT | 0 refills | Status: AC | PRN

## 2023-12-16 NOTE — Progress Notes (Signed)
 Susan Whitney is a 63 y.o. female presents to office today for annual physical exam examination.    Concerns today include: 1.  Cough Patient reports that she has had a couple week history of cough.  She denies any brown or bloody sputum.  No fevers.  She had basically typical head cold but has noticed some wheezing and persistent coughing despite resolution of all other symptoms.  She does report mild postnasal drainage and has not utilized her Xyzal  but does have this at home.  She is a non-smoker  2.  Varicose veins She does report some pain in the varicose veins of bilateral lower extremities.  She was evaluated several years ago but was told that they was nothing they could do at that time due to the size of them.  She reports no ulcerations.  She has not tried compression hose yet.  She feels like she sometimes has restless legs.  Has history of iron deficiency  3. GAD/ MDD Compliant with Zoloft , Wellbutrin .  Reports that she does have some waxing and waning symptoms of anxiety and depression but she feels like this is due to past events and will normalize on its own.  She has seen a counselor in the past but does not feel like she wants to see 1 at this time nor adjust her medications.  She feels "okay".  Occupation: Print production planner, Marital status: married, Substance use: none Health Maintenance Due  Topic Date Due   DTaP/Tdap/Td (1 - Tdap) Never done   Cervical Cancer Screening (HPV/Pap Cotest)  05/12/2017   COVID-19 Vaccine (3 - Moderna risk series) 08/01/2020   Zoster Vaccines- Shingrix  (2 of 2) 02/16/2023   Refills needed today: all  Immunization History  Administered Date(s) Administered   Influenza Split 05/03/2013   Influenza,inj,Quad PF,6+ Mos 04/23/2015, 04/18/2016, 05/01/2017, 04/14/2018, 05/11/2019   Influenza,inj,Quad PF,6-35 Mos 05/01/2014   Influenza-Unspecified 04/21/2015, 05/21/2016   Moderna SARS-COV2 Booster Vaccination 07/04/2020   Moderna Sars-Covid-2  Vaccination 08/15/2019, 09/18/2019   Zoster Recombinant(Shingrix ) 12/22/2022   Past Medical History:  Diagnosis Date   Anxiety    History of gallstones 02/01/2018   Osteopenia    RLS (restless legs syndrome)    S/P cholecystectomy 02/01/2018   Social History   Socioeconomic History   Marital status: Married    Spouse name: Not on file   Number of children: Not on file   Years of education: Not on file   Highest education level: 12th grade  Occupational History   Not on file  Tobacco Use   Smoking status: Never   Smokeless tobacco: Never  Vaping Use   Vaping status: Never Used  Substance and Sexual Activity   Alcohol use: No   Drug use: No   Sexual activity: Not on file  Other Topics Concern   Not on file  Social History 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   Social Drivers of Health   Financial Resource Strain: Low Risk  (12/15/2023)   Overall Financial Resource Strain (CARDIA)    Difficulty of Paying Living Expenses: Not hard at all  Food Insecurity: No Food Insecurity (12/15/2023)   Hunger Vital Sign    Worried About Running Out of Food in the Last Year: Never true    Ran Out of Food in the Last Year: Never true  Transportation Needs: No Transportation Needs (12/15/2023)   PRAPARE - Transportation    Lack of Transportation (Medical): No    Lack of  Transportation (Non-Medical): No  Physical Activity: Inactive (12/15/2023)   Exercise Vital Sign    Days of Exercise per Week: 0 days    Minutes of Exercise per Session: 10 min  Stress: Stress Concern Present (12/15/2023)   [employer] of Occupational Health - Occupational Stress Questionnaire    Feeling of Stress : Rather much  Social Connections: Socially Integrated (12/15/2023)   Social Connection and Isolation Panel [NHANES]    Frequency of Communication with Friends and Family: More than three times a week    Frequency of Social Gatherings with Friends and Family: Three times a week    Attends  Religious Services: More than 4 times per year    Active Member of Clubs or Organizations: Yes    Attends Banker Meetings: More than 4 times per year    Marital Status: Married  Catering manager Violence: Not on file   Past Surgical History:  Procedure Laterality Date   ABDOMINAL HYSTERECTOMY     CHOLECYSTECTOMY     FOOT NEUROMA SURGERY Right 09/2014   SINUS IRRIGATION  2016   STRABISMUS SURGERY Right 05/17/2020   Procedure: REPAIR STRABISMUS RIGHT EYE;  Surgeon: Ozella Blush, MD;  Location: [facility];  Service: Ophthalmology;  Laterality: Right;   TUBAL LIGATION     Family History  Problem Relation Age of Onset   Emphysema Mother        smoked   Hip fracture Mother    Colon cancer Father    Hypertension Sister    Breast cancer Sister    Hypertension Brother     Current Outpatient Medications:    Black Cohosh 160 MG CAPS, black cohosh, Disp: , Rfl:    buPROPion  (WELLBUTRIN  SR) 150 MG 12 hr tablet, TAKE 1 TABLET BY MOUTH ONCE A DAY, Disp: 90 tablet, Rfl: 1   levocetirizine (XYZAL ) 5 MG tablet, Take 1 tablet (5 mg total) by mouth at bedtime as needed for allergies (or drainage)., Disp: 30 tablet, Rfl: PRN   risedronate  (ACTONEL ) 150 MG tablet, Take 1 tablet (150 mg total) by mouth every 30 (thirty) days. with water on empty stomach, nothing by mouth or lie down for next 30 minutes., Disp: 3 tablet, Rfl: 4   sertraline  (ZOLOFT ) 100 MG tablet, TAKE 1 TABLET BY MOUTH ONCE A DAY, Disp: 90 tablet, Rfl: 1   Vitamin D , Ergocalciferol , (DRISDOL ) 1.25 MG (50000 UNIT) CAPS capsule, TAKE 1 CAPSULE BY MOUTH ONCE EVERY WEEK, Disp: 12 capsule, Rfl: 0  No Known Allergies   ROS: Review of Systems A comprehensive review of systems was negative except for: Constitutional: positive for fatigue Respiratory: positive for cough and wheezing Cardiovascular: positive for varicose veins Behavioral/Psych: positive for anxiety and depression   Neuro: Reports feeling like  her legs are restless  Physical exam BP 109/75   Pulse 80   Temp 98.4 F (36.9 C)   Ht 5\' 5"  (1.651 m)   Wt 197 lb (89.4 kg)   SpO2 97%   BMI 32.78 kg/m  General appearance: alert, cooperative, appears stated age, no distress, and mildly obese Head: Normocephalic, without obvious abnormality, atraumatic Eyes: negative findings: lids and lashes normal, conjunctivae and sclerae normal, corneas clear, and pupils equal, round, reactive to light and accomodation Ears: normal TM's and external ear canals both ears Nose: Nares normal. Septum midline. Mucosa normal. No drainage or sinus tenderness. Throat: lips, mucosa, and tongue normal; teeth and gums normal Neck: no adenopathy, no carotid bruit, supple, symmetrical, trachea midline,  and thyroid  not enlarged, symmetric, no tenderness/mass/nodules Back: symmetric, no curvature. ROM normal. No CVA tenderness. Lungs: Global mild expiratory wheezes noted with normal work of breathing on room air and no observed coughing or dyspnea with speech Heart: regular rate and rhythm, S1, S2 normal, no murmur, click, rub or gallop Abdomen: soft, non-tender; bowel sounds normal; no masses,  no organomegaly Extremities: extremities normal, atraumatic, no cyanosis or edema and varicose veins noted Pulses: 2+ and symmetric Skin: Skin color, texture, turgor normal. No rashes or lesions Lymph nodes: Cervical, supraclavicular, and axillary nodes normal. Neurologic: Grossly normal except difficulty with hearing and wears hearing aids      12/16/2023    8:42 AM 06/24/2022    9:44 AM 04/23/2022   11:26 AM  Depression screen PHQ 2/9  Decreased Interest 1 0 0  Down, Depressed, Hopeless 1 0 0  PHQ - 2 Score 2 0 0  Altered sleeping 1    Tired, decreased energy 3    Change in appetite 3    Feeling bad or failure about yourself  1    Trouble concentrating 2    Moving slowly or fidgety/restless 0    Suicidal thoughts 0    PHQ-9 Score 12    Difficult doing  work/chores Somewhat difficult        12/16/2023    8:42 AM 06/24/2022    9:45 AM 04/23/2022   11:26 AM 10/04/2021   10:53 AM  GAD 7 : Generalized Anxiety Score  Nervous, Anxious, on Edge 1 0 0 1  Control/stop worrying 1 0 0 0  Worry too much - different things 1 0 0 2  Trouble relaxing 2 0 0 2  Restless 1 0 0 0  Easily annoyed or irritable 1 0 0 1  Afraid - awful might happen 2 0 0 0  Total GAD 7 Score 9 0 0 6  Anxiety Difficulty Somewhat difficult Not difficult at all Not difficult at all Somewhat difficult     Assessment/ Plan: Valeen Gartner here for annual physical exam.   Annual physical exam  Bronchitis with bronchospasm - Plan: albuterol  (VENTOLIN  HFA) 108 (90 Base) MCG/ACT inhaler, predniSONE  (DELTASONE ) 20 MG tablet  Pain due to varicose veins of both lower extremities - Plan: Ambulatory referral to Vascular Surgery  Pure hypercholesterolemia - Plan: CMP14+EGFR, Lipid panel, Lipid panel, CMP14+EGFR  Age-related osteoporosis without current pathological fracture - Plan: CMP14+EGFR, Vitamin D , 25-hydroxy, risedronate  (ACTONEL ) 150 MG tablet, Vitamin D , Ergocalciferol , (DRISDOL ) 1.25 MG (50000 UNIT) CAPS capsule, Vitamin D , 25-hydroxy, CMP14+EGFR  Vitamin D  deficiency - Plan: Vitamin D , 25-hydroxy, Vitamin D , Ergocalciferol , (DRISDOL ) 1.25 MG (50000 UNIT) CAPS capsule, Vitamin D , 25-hydroxy  Generalized anxiety disorder - Plan: sertraline  (ZOLOFT ) 100 MG tablet  Moderate episode of recurrent major depressive disorder (HCC) - Plan: buPROPion  (WELLBUTRIN  SR) 150 MG 12 hr tablet, sertraline  (ZOLOFT ) 100 MG tablet  Iron deficiency - Plan: Iron, TIBC and Ferritin Panel, CBC, CBC, Iron, TIBC and Ferritin Panel  History of partial hysterectomy  Wheezing appreciated on the exam today.  I am going to place her on a 3-day burst of prednisone  and also prescribe albuterol  for as needed use.  Referral back to vascular surgery for further evaluation given persistent pain.  Encouraged  use of compression hose in the interim  Fasting labs collected today.  Continue all medication as prescribed and refills have been sent of all prescribed medicines.  She will reach out to me if she changes her mind about referral  to counseling  Her anxiety and depressive scores are elevated today but she declined referral to therapy or adjustment of medications at this time.  She is coping independently and wants to continue current regimen  Check for any evidence of iron deficiency or anemia given history of iron deficiency  Counseled on healthy lifestyle choices, including diet (rich in fruits, vegetables and lean meats and low in salt and simple carbohydrates) and exercise (at least 30 minutes of moderate physical activity daily).  Patient to follow up 1 year for CPE  Faren Florence M. Bonnell Butcher, DO

## 2023-12-16 NOTE — Patient Instructions (Signed)

## 2023-12-16 NOTE — Telephone Encounter (Signed)
 Name from pharmacy: RISEDRONATE SODIUM 150 MG TAB  Pharmacy comment: Alternative Requested:NOT COVERED BY INSURANCE.

## 2023-12-17 LAB — IRON,TIBC AND FERRITIN PANEL
Ferritin: 34 ng/mL (ref 15–150)
Iron Saturation: 18 % (ref 15–55)
Iron: 60 ug/dL (ref 27–139)
Total Iron Binding Capacity: 326 ug/dL (ref 250–450)
UIBC: 266 ug/dL (ref 118–369)

## 2023-12-17 LAB — CMP14+EGFR
ALT: 7 IU/L (ref 0–32)
AST: 17 IU/L (ref 0–40)
Albumin: 4.7 g/dL (ref 3.9–4.9)
Alkaline Phosphatase: 129 IU/L — ABNORMAL HIGH (ref 44–121)
BUN/Creatinine Ratio: 20 (ref 12–28)
BUN: 13 mg/dL (ref 8–27)
Bilirubin Total: 0.3 mg/dL (ref 0.0–1.2)
CO2: 21 mmol/L (ref 20–29)
Calcium: 9.6 mg/dL (ref 8.7–10.3)
Chloride: 100 mmol/L (ref 96–106)
Creatinine, Ser: 0.65 mg/dL (ref 0.57–1.00)
Globulin, Total: 3 g/dL (ref 1.5–4.5)
Glucose: 93 mg/dL (ref 70–99)
Potassium: 5 mmol/L (ref 3.5–5.2)
Sodium: 137 mmol/L (ref 134–144)
Total Protein: 7.7 g/dL (ref 6.0–8.5)
eGFR: 99 mL/min/{1.73_m2} (ref >59)

## 2023-12-17 LAB — LIPID PANEL
Cholesterol, Total: 208 mg/dL — ABNORMAL HIGH (ref 100–199)
HDL: 61 mg/dL (ref >39)
LDL CALC COMMENT:: 3.4 ratio (ref 0.0–4.4)
LDL Chol Calc (NIH): 133 mg/dL — ABNORMAL HIGH (ref 0–99)
Triglycerides: 79 mg/dL (ref 0–149)
VLDL Cholesterol Cal: 14 mg/dL (ref 5–40)

## 2023-12-17 LAB — CBC
Hematocrit: 46 % (ref 34.0–46.6)
Hemoglobin: 14.6 g/dL (ref 11.1–15.9)
MCH: 26.3 pg — ABNORMAL LOW (ref 26.6–33.0)
MCHC: 31.7 g/dL (ref 31.5–35.7)
MCV: 83 fL (ref 79–97)
Platelets: 396 10*3/uL (ref 150–450)
RBC: 5.55 x10E6/uL — ABNORMAL HIGH (ref 3.77–5.28)
RDW: 15.6 % — ABNORMAL HIGH (ref 11.7–15.4)
WBC: 9.1 10*3/uL (ref 3.4–10.8)

## 2023-12-17 LAB — VITAMIN D 25 HYDROXY (VIT D DEFICIENCY, FRACTURES): Vit D, 25-Hydroxy: 57.2 ng/mL (ref 30.0–100.0)

## 2023-12-18 ENCOUNTER — Ambulatory Visit: Payer: Self-pay | Admitting: Family Medicine

## 2024-02-15 ENCOUNTER — Other Ambulatory Visit: Payer: Self-pay

## 2024-02-15 DIAGNOSIS — I83893 Varicose veins of bilateral lower extremities with other complications: Secondary | ICD-10-CM

## 2024-02-22 ENCOUNTER — Other Ambulatory Visit: Payer: Self-pay | Admitting: Family Medicine

## 2024-02-22 DIAGNOSIS — E559 Vitamin D deficiency, unspecified: Secondary | ICD-10-CM

## 2024-02-22 DIAGNOSIS — M810 Age-related osteoporosis without current pathological fracture: Secondary | ICD-10-CM

## 2024-02-22 NOTE — Telephone Encounter (Signed)
 Changing to mail order, remaining refills sent

## 2024-03-03 ENCOUNTER — Encounter (HOSPITAL_COMMUNITY)

## 2024-03-03 ENCOUNTER — Encounter: Admitting: Vascular Surgery

## 2024-03-30 ENCOUNTER — Ambulatory Visit: Admitting: Family Medicine

## 2024-04-06 DIAGNOSIS — H903 Sensorineural hearing loss, bilateral: Secondary | ICD-10-CM | POA: Diagnosis not present

## 2024-05-19 ENCOUNTER — Encounter: Admitting: Vascular Surgery

## 2024-05-19 ENCOUNTER — Encounter (HOSPITAL_COMMUNITY)

## 2024-05-23 ENCOUNTER — Encounter: Payer: Self-pay | Admitting: Family Medicine

## 2024-05-23 ENCOUNTER — Ambulatory Visit (INDEPENDENT_AMBULATORY_CARE_PROVIDER_SITE_OTHER): Admitting: Family Medicine

## 2024-05-23 VITALS — BP 123/81 | HR 89 | Temp 97.8°F | Ht 65.0 in

## 2024-05-23 DIAGNOSIS — J069 Acute upper respiratory infection, unspecified: Secondary | ICD-10-CM | POA: Diagnosis not present

## 2024-05-23 MED ORDER — BENZONATATE 200 MG PO CAPS: 200.0000 mg | ORAL_CAPSULE | Freq: Three times a day (TID) | ORAL | 0 refills | Status: AC | PRN

## 2024-05-23 MED ORDER — PROMETHAZINE-DM 6.25-15 MG/5ML PO SYRP: 2.5000 mL | ORAL_SOLUTION | Freq: Four times a day (QID) | ORAL | 0 refills | Status: AC | PRN

## 2024-05-23 NOTE — Progress Notes (Signed)
 Subjective: RR:rnlhy PCP: Jolinda Norene HERO, DO HPI:Susan Whitney is a 63 y.o. female presenting to clinic today for:  Patient reports persistent cough.  She had onset of URI mid October and the symptoms have gotten better except she has persistent drainage, occasional wheeze and a coarse persistent cough.  Denies any hemoptysis or change in color to the sputum.  No shortness of breath.  No fevers.  She has been utilizing over-the-counter Robitussin DM but has not been utilizing her oral antihistamine nor her inhaler.  She did take a couple of days of Pepcid  because she thought maybe it might be related to acid reflux and that did seem to help a little bit   ROS: Per HPI  No Known Allergies Past Medical History:  Diagnosis Date   Anxiety    History of gallstones 02/01/2018   Osteopenia    RLS (restless legs syndrome)    S/P cholecystectomy 02/01/2018    Current Outpatient Medications:    albuterol  (VENTOLIN  HFA) 108 (90 Base) MCG/ACT inhaler, Inhale 2 puffs into the lungs every 6 (six) hours as needed for wheezing or shortness of breath., Disp: 8 g, Rfl: 0   alendronate  (FOSAMAX ) 70 MG tablet, Take 1 tablet (70 mg total) by mouth every 7 (seven) days., Disp: 12 tablet, Rfl: 4   benzonatate  (TESSALON ) 200 MG capsule, Take 1 capsule (200 mg total) by mouth 3 (three) times daily as needed for cough., Disp: 20 capsule, Rfl: 0   Black Cohosh 160 MG CAPS, black cohosh, Disp: , Rfl:    buPROPion  (WELLBUTRIN  SR) 150 MG 12 hr tablet, Take 1 tablet (150 mg total) by mouth daily., Disp: 90 tablet, Rfl: 3   promethazine -dextromethorphan (PROMETHAZINE -DM) 6.25-15 MG/5ML syrup, Take 2.5 mLs by mouth 4 (four) times daily as needed for cough., Disp: 118 mL, Rfl: 0   sertraline  (ZOLOFT ) 100 MG tablet, Take 1 tablet (100 mg total) by mouth daily., Disp: 90 tablet, Rfl: 3   Vitamin D , Ergocalciferol , (DRISDOL ) 1.25 MG (50000 UNIT) CAPS capsule, TAKE 1 CAPSULE BY MOUTH ONCE EVERY WEEK, Disp: 12 capsule,  Rfl: 2   levocetirizine (XYZAL ) 5 MG tablet, Take 1 tablet (5 mg total) by mouth at bedtime as needed for allergies (or drainage)., Disp: 90 tablet, Rfl: 3 Social History   Socioeconomic History   Marital status: Married    Spouse name: Not on file   Number of children: Not on file   Years of education: Not on file   Highest education level: 12th grade  Occupational History   Not on file  Tobacco Use   Smoking status: Never   Smokeless tobacco: Never  Vaping Use   Vaping status: Never Used  Substance and Sexual Activity   Alcohol use: No   Drug use: No   Sexual activity: Not on file  Other Topics Concern   Not on file  Social History Narrative   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   Social Drivers of Health   Financial Resource Strain: Low Risk  (12/15/2023)   Overall Financial Resource Strain (CARDIA)    Difficulty of Paying Living Expenses: Not hard at all  Food Insecurity: No Food Insecurity (12/15/2023)   Hunger Vital Sign    Worried About Running Out of Food in the Last Year: Never true    Ran Out of Food in the Last Year: Never true  Transportation Needs: No Transportation Needs (12/15/2023)   PRAPARE - Administrator, Civil Service (Medical):  No    Lack of Transportation (Non-Medical): No  Physical Activity: Inactive (12/15/2023)   Exercise Vital Sign    Days of Exercise per Week: 0 days    Minutes of Exercise per Session: 10 min  Stress: Stress Concern Present (12/15/2023)   [employer] of Occupational Health - Occupational Stress Questionnaire    Feeling of Stress : Rather much  Social Connections: Socially Integrated (12/15/2023)   Social Connection and Isolation Panel    Frequency of Communication with Friends and Family: More than three times a week    Frequency of Social Gatherings with Friends and Family: Three times a week    Attends Religious Services: More than 4 times per year    Active Member of Clubs or Organizations: Yes     Attends Engineer, Structural: More than 4 times per year    Marital Status: Married  Catering Manager Violence: Not on file   Family History  Problem Relation Age of Onset   Emphysema Mother        smoked   Hip fracture Mother    Colon cancer Father    Hypertension Sister    Breast cancer Sister    Hypertension Brother     Objective: Office vital signs reviewed. BP 123/81   Pulse 89   Temp 97.8 F (36.6 C)   Ht 5' 5 (1.651 m)   SpO2 93%   BMI 32.78 kg/m   Physical Examination:  General: Awake, alert, well nourished, No acute distress HEENT: Normal    Neck: No masses palpated. No lymphadenopathy    Ears: Tympanic membranes intact, normal light reflex, no erythema, no bulging    Eyes: extraocular membranes intact, sclera white    Nose: nasal turbinates moist, clear nasal discharge    Throat: moist mucus membranes, no erythema, no tonsillar exudate.  Airway is patent Cardio: regular rate and rhythm, S1S2 heard, no murmurs appreciated Pulm: clear to auscultation bilaterally, no wheezes, rhonchi or rales; normal work of breathing on room air  Assessment/ Plan: 63 y.o. female   Viral URI with cough - Plan: promethazine -dextromethorphan (PROMETHAZINE -DM) 6.25-15 MG/5ML syrup, benzonatate  (TESSALON ) 200 MG capsule   Suspect postviral cough syndrome.  We discussed that drainage would improve if she would start xyzal .  Will send over promethazine  and Tessalon  Perles.  We discussed red flag signs and symptoms warranting further evaluation and/or antibiotic treatment.  If no significant improvement over the next week with no other change in symptoms, we could consider trial of acid reducer as Pepcid  was somewhat effective when she took it for a couple of days   Norene CHRISTELLA Fielding, DO [facility] [phone]

## 2024-05-31 ENCOUNTER — Encounter: Payer: Self-pay | Admitting: Family Medicine

## 2024-05-31 DIAGNOSIS — R053 Chronic cough: Secondary | ICD-10-CM

## 2024-05-31 MED ORDER — PANTOPRAZOLE SODIUM 40 MG PO TBEC: 40.0000 mg | DELAYED_RELEASE_TABLET | Freq: Every day | ORAL | 3 refills | Status: AC

## 2024-06-14 ENCOUNTER — Encounter (INDEPENDENT_AMBULATORY_CARE_PROVIDER_SITE_OTHER): Payer: Self-pay | Admitting: Family Medicine

## 2024-06-14 DIAGNOSIS — R062 Wheezing: Secondary | ICD-10-CM

## 2024-06-14 DIAGNOSIS — R053 Chronic cough: Secondary | ICD-10-CM

## 2024-06-14 MED ORDER — PREDNISONE 20 MG PO TABS: 40.0000 mg | ORAL_TABLET | Freq: Every day | ORAL | 0 refills | Status: AC

## 2024-06-14 NOTE — Telephone Encounter (Signed)

## 2024-06-27 ENCOUNTER — Other Ambulatory Visit: Payer: Self-pay | Admitting: Family Medicine

## 2024-06-27 DIAGNOSIS — F331 Major depressive disorder, recurrent, moderate: Secondary | ICD-10-CM

## 2024-06-27 DIAGNOSIS — F411 Generalized anxiety disorder: Secondary | ICD-10-CM

## 2024-09-22 ENCOUNTER — Encounter: Admitting: Vascular Surgery

## 2024-09-22 ENCOUNTER — Ambulatory Visit (HOSPITAL_COMMUNITY)

## 2024-12-16 ENCOUNTER — Encounter: Payer: Self-pay | Admitting: Family Medicine
